# Patient Record
Sex: Male | Born: 1970 | Race: White | Hispanic: No | Marital: Single | Smoking: Former smoker
Health system: Southern US, Community
[De-identification: ages and names within clinical notes are randomized; demographics above are authoritative.]

## PROBLEM LIST (undated history)

## (undated) DIAGNOSIS — G8929 Other chronic pain: Secondary | ICD-10-CM

## (undated) DIAGNOSIS — F419 Anxiety disorder, unspecified: Secondary | ICD-10-CM

## (undated) DIAGNOSIS — E119 Type 2 diabetes mellitus without complications: Secondary | ICD-10-CM

## (undated) HISTORY — PX: TONSILLECTOMY: SUR1361

## (undated) HISTORY — PX: OTHER SURGICAL HISTORY: SHX169

## (undated) HISTORY — PX: ADENOIDECTOMY: SUR15

## (undated) HISTORY — PX: DENTAL SURGERY: SHX609

---

## 2019-06-29 ENCOUNTER — Emergency Department (HOSPITAL_COMMUNITY)
Admission: EM | Admit: 2019-06-29 | Discharge: 2019-06-29 | Disposition: A | Discharge status: Home / Self Care | Payer: PRIVATE HEALTH INSURANCE | Attending: Emergency Medicine | Admitting: Emergency Medicine

## 2019-06-29 ENCOUNTER — Encounter (HOSPITAL_COMMUNITY): Payer: Self-pay | Admitting: Emergency Medicine

## 2019-06-29 ENCOUNTER — Other Ambulatory Visit: Payer: Self-pay

## 2019-06-29 DIAGNOSIS — G8929 Other chronic pain: Secondary | ICD-10-CM | POA: Diagnosis not present

## 2019-06-29 DIAGNOSIS — M25512 Pain in left shoulder: Secondary | ICD-10-CM | POA: Insufficient documentation

## 2019-06-29 HISTORY — DX: Other chronic pain: G89.29

## 2019-06-29 NOTE — ED Notes (Signed)
An After Visit Summary was printed and given to the patient. Discharge instructions given and no further questions at this time. Pt leaving with work note as requested.

## 2019-06-29 NOTE — ED Provider Notes (Signed)
[facility]   CSN: [id number] Arrival date & time: 06/29/19  1703     History   Chief Complaint Chief Complaint  Patient presents with  . needs MD note    HPI Eric Kane is a 48 y.o. male with past medical 3 of chronic shoulder pain who presents for evaluation of left shoulder pain.  He states that yesterday at work, he felt like he exacerbated his shoulder which caused return of his chronic shoulder pain.  He did not have any trauma, injury, fall.  He states that the pain is mostly a sharp shooting pain that starts in the shoulder and goes down.  He states that it hurts worse with movement.  He states that he missed work today because of his pain and states that he needs a work note for today.  He denies any redness, swelling of the shoulder, numbness/weakness.     The history is provided by the patient.    Past Medical History:  Diagnosis Date  . Chronic shoulder pain     There are no active problems to display for this patient.   History reviewed. No pertinent surgical history.      Home Medications    Prior to Admission medications   Not on File    Family History No family history on file.  Social History Social History   Tobacco Use  . Smoking status: Not on file  Substance Use Topics  . Alcohol use: Not Currently  . Drug use: Not Currently     Allergies   Patient has no allergy information on record.   Review of Systems Review of Systems  Constitutional: Negative for fever.  Musculoskeletal:       Shoulder pain  Neurological: Negative for weakness and numbness.  All other systems reviewed and are negative.    Physical Exam Updated Vital Signs BP 110/69   Pulse 90   Temp 98.3 F (36.8 C) (Oral)   Resp 18   SpO2 99%   Physical Exam Vitals signs and nursing note reviewed.  Constitutional:      Appearance: He is well-developed.  HENT:     Head: Normocephalic and atraumatic.   Eyes:     General: No scleral icterus.       Right eye: No discharge.        Left eye: No discharge.     Conjunctiva/sclera: Conjunctivae normal.  Cardiovascular:     Pulses:          Radial pulses are 2+ on the right side and 2+ on the left side.  Pulmonary:     Effort: Pulmonary effort is normal.  Musculoskeletal:     Comments: Limited range of motion to left shoulder secondary to pain.  No overlying warmth, erythema, edema.  Diffuse tenderness in the left shoulder.  Bilateral upper extremities are symmetric in appearance.  No bony tenderness noted to the left elbow, left forearm, left wrist.  Flexion/tension of elbow intact without any difficulty.  Skin:    General: Skin is warm and dry.     Capillary Refill: Capillary refill takes less than 2 seconds.     Comments: Good distal cap refill. LUE is not dusky in appearance or cool to touch.  Neurological:     Mental Status: He is alert.     Comments: Sensation intact along major nerve distributions of BUE  Psychiatric:        Speech: Speech normal.  Behavior: Behavior normal.      ED Treatments / Results  Labs (all labs ordered are listed, but only abnormal results are displayed) Labs Reviewed - No data to display  EKG None  Radiology No results found.  Procedures Procedures (including critical care time)  Medications Ordered in ED Medications - No data to display   Initial Impression / Assessment and Plan / ED Course  I have reviewed the triage vital signs and the nursing notes.  Pertinent labs & imaging results that were available during my care of the patient were reviewed by me and considered in my medical decision making (see chart for details).        48 year old male with chronic shoulder pain who presents for evaluation of left shoulder pain.  He states that yesterday while at work, he had exacerbation of his chronic pain.  No trauma, injury.  No fevers, redness, swelling, numbness/weakness.  He is  coming to the emergency department today requesting work note. Patient is afebrile, non-toxic appearing, sitting comfortably on examination table. Vital signs reviewed and stable.  Patient is neurovascularly intact.  On exam, he has limited range of motion secondary to pain.  He states that this is consistent with his chronic shoulder pain.  He has no history of trauma, injury, fall.  He states this is exacerbation of his chronic pain.  No negation for imaging at this time.  History/physical exam not concerning for septic arthritis, ischemic limb, DVT of upper extremity.  I discussed with patient that I cannot provide him a work note for past experiences.  Will give outpatient orthopedic referral given chronic nature of symptoms. At this time, patient exhibits no emergent life-threatening condition that require further evaluation in ED or admission. Patient had ample opportunity for questions and discussion. All patient's questions were answered with full understanding. Strict return precautions discussed. Patient expresses understanding and agreement to plan.   Portions of this note were generated with L[employer]. Dictation errors may occur despite best attempts at proofreading.   Final Clinical Impressions(s) / ED Diagnoses   Final diagnoses:  Chronic left shoulder pain    ED Discharge Orders    None       Desma Mcgregor 06/29/19 2153    Dorie Rank, MD 06/30/19 1646

## 2019-06-29 NOTE — Discharge Instructions (Signed)
You can take Tylenol or Ibuprofen as directed for pain. You can alternate Tylenol and Ibuprofen every 4 hours. If you take Tylenol at 1pm, then you can take Ibuprofen at 5pm. Then you can take Tylenol again at 9pm.   Follow-up with referred orthopedic doctor.  Return the emergency department for any worsening pain, redness or swelling of the shoulder, fevers, numbness/weakness.

## 2019-06-29 NOTE — ED Triage Notes (Signed)
Per pt, states he has chronic left shoulder nerve pain-states he missed work today and due to living in a [employer], he needs a doctors note

## 2019-07-17 ENCOUNTER — Emergency Department (HOSPITAL_COMMUNITY)
Admission: EM | Admit: 2019-07-17 | Discharge: 2019-07-18 | Disposition: A | Discharge status: Home / Self Care | Payer: 59 | Attending: Emergency Medicine | Admitting: Emergency Medicine

## 2019-07-17 ENCOUNTER — Other Ambulatory Visit: Payer: Self-pay

## 2019-07-17 ENCOUNTER — Encounter (HOSPITAL_COMMUNITY): Payer: Self-pay | Admitting: Emergency Medicine

## 2019-07-17 DIAGNOSIS — Z760 Encounter for issue of repeat prescription: Secondary | ICD-10-CM | POA: Insufficient documentation

## 2019-07-17 DIAGNOSIS — R739 Hyperglycemia, unspecified: Secondary | ICD-10-CM

## 2019-07-17 DIAGNOSIS — Z794 Long term (current) use of insulin: Secondary | ICD-10-CM | POA: Diagnosis not present

## 2019-07-17 DIAGNOSIS — F17220 Nicotine dependence, chewing tobacco, uncomplicated: Secondary | ICD-10-CM | POA: Insufficient documentation

## 2019-07-17 DIAGNOSIS — R631 Polydipsia: Secondary | ICD-10-CM | POA: Diagnosis present

## 2019-07-17 DIAGNOSIS — E1165 Type 2 diabetes mellitus with hyperglycemia: Secondary | ICD-10-CM | POA: Insufficient documentation

## 2019-07-17 HISTORY — DX: Type 2 diabetes mellitus without complications: E11.9

## 2019-07-17 LAB — CBC
HCT: 47.3 % (ref 39.0–52.0)
Hemoglobin: 14.9 g/dL (ref 13.0–17.0)
MCH: 30.9 pg (ref 26.0–34.0)
MCHC: 31.5 g/dL (ref 30.0–36.0)
MCV: 98.1 fL (ref 80.0–100.0)
Platelets: 343 10*3/uL (ref 150–400)
RBC: 4.82 MIL/uL (ref 4.22–5.81)
RDW: 12.9 % (ref 11.5–15.5)
WBC: 10.8 10*3/uL — ABNORMAL HIGH (ref 4.0–10.5)
nRBC: 0 % (ref 0.0–0.2)

## 2019-07-17 LAB — URINALYSIS, ROUTINE W REFLEX MICROSCOPIC
Bacteria, UA: NONE SEEN
Bilirubin Urine: NEGATIVE
Glucose, UA: >=500 mg/dL — AB
Hgb urine dipstick: NEGATIVE
Ketones, ur: NEGATIVE mg/dL
Leukocytes,Ua: NEGATIVE
Nitrite: NEGATIVE
Protein, ur: NEGATIVE mg/dL
Specific Gravity, Urine: 1.028 (ref 1.005–1.030)
pH: 6 (ref 5.0–8.0)

## 2019-07-17 LAB — CBG MONITORING, ED: Glucose-Capillary: >600 mg/dL (ref 70–99)

## 2019-07-17 NOTE — ED Triage Notes (Addendum)
Patient states he has blood sugar due to not having the medication and needles. Patient states he has been out of his medication for two days. Patient states he takes Novolog, lantus, and metformin. Patient been out of lantus for two months.

## 2019-07-17 NOTE — ED Notes (Addendum)
CRITICAL VALUE STICKER  CRITICAL VALUE: Glu 1084  RECEIVER (on-site recipient of call): Jake T RN  DATE & TIME NOTIFIED: 07/17/19  MESSENGER (representative from lab): LE  TIME OF NOTIFICATION: 1114p  RESPONSE: see orders

## 2019-07-18 ENCOUNTER — Encounter (HOSPITAL_COMMUNITY): Payer: Self-pay | Admitting: Emergency Medicine

## 2019-07-18 LAB — CBG MONITORING, ED
Glucose-Capillary: 540 mg/dL (ref 70–99)
Glucose-Capillary: 355 mg/dL — ABNORMAL HIGH (ref 70–99)
Glucose-Capillary: 448 mg/dL — ABNORMAL HIGH (ref 70–99)
Glucose-Capillary: 237 mg/dL — ABNORMAL HIGH (ref 70–99)
Glucose-Capillary: 307 mg/dL — ABNORMAL HIGH (ref 70–99)
Glucose-Capillary: 159 mg/dL — ABNORMAL HIGH (ref 70–99)
Glucose-Capillary: 170 mg/dL — ABNORMAL HIGH (ref 70–99)
Glucose-Capillary: 268 mg/dL — ABNORMAL HIGH (ref 70–99)

## 2019-07-18 LAB — BLOOD GAS, VENOUS
Acid-Base Excess: 0.4 mmol/L (ref 0.0–2.0)
Bicarbonate: 26.4 mmol/L (ref 20.0–28.0)
O2 Saturation: 79.3 %
Patient temperature: 98.6
pCO2, Ven: 50.5 mmHg (ref 44.0–60.0)
pH, Ven: 7.339 (ref 7.250–7.430)
pO2, Ven: 49.1 mmHg — ABNORMAL HIGH (ref 32.0–45.0)

## 2019-07-18 LAB — BASIC METABOLIC PANEL
Anion gap: 14 (ref 5–15)
BUN: 20 mg/dL (ref 6–20)
CO2: 28 mmol/L (ref 22–32)
Calcium: 11.3 mg/dL — ABNORMAL HIGH (ref 8.9–10.3)
Chloride: 82 mmol/L — ABNORMAL LOW (ref 98–111)
Creatinine, Ser: 1.41 mg/dL — ABNORMAL HIGH (ref 0.61–1.24)
GFR calc Af Amer: >60 mL/min (ref >60)
GFR calc non Af Amer: 59 mL/min — ABNORMAL LOW (ref >60)
Glucose, Bld: 1084 mg/dL (ref 70–99)
Potassium: 4.9 mmol/L (ref 3.5–5.1)
Sodium: 124 mmol/L — ABNORMAL LOW (ref 135–145)

## 2019-07-18 LAB — OSMOLALITY: Osmolality: 312 mOsm/kg — ABNORMAL HIGH (ref 275–295)

## 2019-07-18 MED ORDER — SODIUM CHLORIDE 0.9 % IV SOLN: INTRAVENOUS | Status: DC

## 2019-07-18 MED ORDER — DEXTROSE 50 % IV SOLN: 0.0000 mL | INTRAVENOUS | Status: DC | PRN

## 2019-07-18 MED ORDER — INSULIN GLARGINE 100 UNIT/ML ~~LOC~~ SOLN: 20.0000 [IU] | Freq: Every day | SUBCUTANEOUS | 0 refills | Status: DC

## 2019-07-18 MED ORDER — SODIUM CHLORIDE 0.9 % IV BOLUS
20.0000 mL/kg | Freq: Once | INTRAVENOUS | Status: AC
  Administered 2019-07-18: 2268 mL via INTRAVENOUS

## 2019-07-18 MED ORDER — POTASSIUM CHLORIDE 10 MEQ/100ML IV SOLN
10.0000 meq | INTRAVENOUS | Status: AC
  Administered 2019-07-18: 10 meq via INTRAVENOUS
  Filled 2019-07-18: qty 100

## 2019-07-18 MED ORDER — METFORMIN HCL 500 MG PO TABS: 500.0000 mg | ORAL_TABLET | Freq: Two times a day (BID) | ORAL | 0 refills | Status: DC

## 2019-07-18 MED ORDER — INSULIN REGULAR(HUMAN) IN NACL 100-0.9 UT/100ML-% IV SOLN
INTRAVENOUS | Status: DC
  Administered 2019-07-18: 10.5 [IU]/h via INTRAVENOUS
  Filled 2019-07-18: qty 100

## 2019-07-18 MED ORDER — INSULIN ASPART 100 UNIT/ML ~~LOC~~ SOLN: 8.0000 [IU] | Freq: Three times a day (TID) | SUBCUTANEOUS | 0 refills | Status: DC

## 2019-07-18 MED ORDER — DEXTROSE-NACL 5-0.45 % IV SOLN: INTRAVENOUS | Status: DC

## 2019-07-18 MED ORDER — METFORMIN HCL 500 MG PO TABS
500.0000 mg | ORAL_TABLET | Freq: Once | ORAL | Status: AC
  Administered 2019-07-18: 500 mg via ORAL
  Filled 2019-07-18: qty 1

## 2019-07-18 MED ORDER — INSULIN GLARGINE 100 UNIT/ML ~~LOC~~ SOLN
10.0000 [IU] | Freq: Once | SUBCUTANEOUS | Status: AC
  Administered 2019-07-18: 10 [IU] via SUBCUTANEOUS
  Filled 2019-07-18: qty 0.1

## 2019-07-18 NOTE — Care Management (Signed)
ED CM received call from [facility] ED CSW concerning medication assistance. Contacted patient by phone at [facility] patient reports not having prescription coverage with Obamacare insurance which ends in 2 days.  Patient is a diabetic on insulin. CM discussed [place] program with guidelines, patient is agreeable and he was enrolled. Patient states utilizes a Psychologist, clinical, CM discussed having to send prescriptions to a retail pharmacy that will be open today. Patient request prescription be sent to neighborhood [facility] on [address]. Fax sent to  [phone] confirmation received.

## 2019-07-18 NOTE — Care Management (Signed)
  [place] Medication Assistance Card Name: Nabil Bubolz ID (MRN): [id number] [place]: [id number] RX Group: [id number] Discharge Date: 07/18/2019 Expiration Date:  07/29/2019                                           (must be filled within 7 days of discharge)      Dear   : Brendia Sacks  You have been approved to have the prescriptions written by your discharging physician filled through our [facility] (Medication Assistance Through [facility]) program. This program allows for a one-time (no refills) 34-day supply of selected medications for a low copay amount.  The copay is $3.00 per prescription. For instance, if you have one prescription, you will pay $3.00; for two prescriptions, you pay $6.00; for three prescriptions, you pay $9.00; and so on.  Only certain pharmacies are participating in this program with [facility]. You will need to select one of the pharmacies from the attached list and take your prescriptions, this letter, and your photo ID to one of the participating pharmacies.   We are excited that you are able to use the [facility] program to get your medications. These prescriptions must be filled within 7 days of hospital discharge or they will no longer be valid for the [facility] program. Should you have any problems with your prescriptions please contact your case management team member at [phone] for [place]/ [place] you, [place] Management Laurena Slimmer RN, BSN  ED Care Manager [phone]

## 2019-07-18 NOTE — ED Notes (Signed)
RN has spoken to Case management. Case Management will be calling patient cell phone to gather more information. Case Management will message or call RN to keep RN informed of plan of action for patient.

## 2019-07-18 NOTE — ED Provider Notes (Signed)
[facility] DEPT Provider Note: Georgena Spurling, MD, FACEP  CSN: [id number] MRN: [id number] ARRIVAL: 07/17/19 at 2132 ROOM: [place]  Hyperglycemia   HISTORY OF PRESENT ILLNESS  07/18/19 12:53 AM Eric Kane is a 48 y.o. male with type 2 diabetes.  He is out of his Metformin and has been out of his Lantus for 2 weeks.  He is here with a week history of blurred vision, polydipsia, polyuria and lethargy.  On arrival his sugar read off scale high and on his bmet was noted to be 1084.  He denies any pain, nausea or vomiting.    Past Medical History:  Diagnosis Date   Chronic shoulder pain    Diabetes mellitus ([place])     History reviewed. No pertinent surgical history.  History reviewed. No pertinent family history.  Social History   Tobacco Use   Smoking status: Never Smoker   Smokeless tobacco: Current User    Types: Snuff  Substance Use Topics   Alcohol use: Not Currently   Drug use: Not Currently    Prior to Admission medications   Medication Sig Start Date End Date Taking? Authorizing Provider  insulin aspart (NOVOLOG) 100 UNIT/ML injection Inject 8 Units into the skin 3 (three) times daily before meals.   Yes [provider]  metFORMIN (GLUCOPHAGE) 500 MG tablet Take 500 mg by mouth 2 (two) times daily with a meal.   Yes [provider]  insulin glargine (LANTUS) 100 UNIT/ML injection Inject 0.2 mLs (20 Units total) into the skin at bedtime. 07/18/19   Jakwan Sally, MD    Allergies Cephalosporins and Lithium   REVIEW OF SYSTEMS  Negative except as noted here or in the History of Present Illness.   PHYSICAL EXAMINATION  Initial Vital Signs Blood pressure 118/87, pulse 89, temperature 98.7 F (37.1 C), temperature source Oral, resp. rate 16, height 5\' 10"  (1.778 m), weight 113.4 kg, SpO2 99 %.  Examination General: Well-developed, well-nourished male in no acute distress; appearance consistent with age of  record HENT: normocephalic; atraumatic Eyes: pupils equal, round and reactive to light; extraocular muscles intact Neck: supple Heart: regular rate and rhythm Lungs: clear to auscultation bilaterally Abdomen: soft; nondistended; nontender; bowel sounds present Extremities: No deformity; full range of motion; pulses normal Neurologic: Awake, alert and oriented; motor function intact in all extremities and symmetric; no facial droop Skin: Warm and dry Psychiatric: Normal mood and affect   RESULTS  Summary of this visit's results, reviewed and interpreted by myself:   EKG Interpretation  Date/Time:    Ventricular Rate:    PR Interval:    QRS Duration:   QT Interval:    QTC Calculation:   R Axis:     Text Interpretation:        Laboratory Studies: Results for orders placed or performed during the hospital encounter of 07/17/19 (from the past 24 hour(s))  CBG monitoring, ED     Status: Abnormal   Collection Time: 07/17/19  9:55 PM  Result Value Ref Range   Glucose-Capillary >600 (HH) 70 - 99 mg/dL  Basic metabolic panel     Status: Abnormal   Collection Time: 07/17/19 10:09 PM  Result Value Ref Range   Sodium 124 (L) 135 - 145 mmol/L   Potassium 4.9 3.5 - 5.1 mmol/L   Chloride 82 (L) 98 - 111 mmol/L   CO2 28 22 - 32 mmol/L   Glucose, Bld 1,084 (HH) 70 - 99 mg/dL   BUN 20  6 - 20 mg/dL   Creatinine, Ser 7.48 (H) 0.61 - 1.24 mg/dL   Calcium 27.0 (H) 8.9 - 10.3 mg/dL   GFR calc non Af Amer 59 (L) >60 mL/min   GFR calc Af Amer >60 >60 mL/min   Anion gap 14 5 - 15  CBC     Status: Abnormal   Collection Time: 07/17/19 10:09 PM  Result Value Ref Range   WBC 10.8 (H) 4.0 - 10.5 K/uL   RBC 4.82 4.22 - 5.81 MIL/uL   Hemoglobin 14.9 13.0 - 17.0 g/dL   HCT 78.6 75.4 - 49.2 %   MCV 98.1 80.0 - 100.0 fL   MCH 30.9 26.0 - 34.0 pg   MCHC 31.5 30.0 - 36.0 g/dL   RDW 01.0 07.1 - 21.9 %   Platelets 343 150 - 400 K/uL   nRBC 0.0 0.0 - 0.2 %  Urinalysis, Routine w reflex  microscopic     Status: Abnormal   Collection Time: 07/17/19 10:09 PM  Result Value Ref Range   Color, Urine STRAW (A) YELLOW   APPearance CLEAR CLEAR   Specific Gravity, Urine 1.028 1.005 - 1.030   pH 6.0 5.0 - 8.0   Glucose, UA >=500 (A) NEGATIVE mg/dL   Hgb urine dipstick NEGATIVE NEGATIVE   Bilirubin Urine NEGATIVE NEGATIVE   Ketones, ur NEGATIVE NEGATIVE mg/dL   Protein, ur NEGATIVE NEGATIVE mg/dL   Nitrite NEGATIVE NEGATIVE   Leukocytes,Ua NEGATIVE NEGATIVE   Bacteria, UA NONE SEEN NONE SEEN  Osmolality     Status: Abnormal   Collection Time: 07/18/19  1:57 AM  Result Value Ref Range   Osmolality 312 (H) 275 - 295 mOsm/kg  Blood gas, venous     Status: Abnormal   Collection Time: 07/18/19  2:40 AM  Result Value Ref Range   pH, Ven 7.339 7.250 - 7.430   pCO2, Ven 50.5 44.0 - 60.0 mmHg   pO2, Ven 49.1 (H) 32.0 - 45.0 mmHg   Bicarbonate 26.4 20.0 - 28.0 mmol/L   Acid-Base Excess 0.4 0.0 - 2.0 mmol/L   O2 Saturation 79.3 %   Patient temperature 98.6   CBG monitoring, ED     Status: Abnormal   Collection Time: 07/18/19  2:46 AM  Result Value Ref Range   Glucose-Capillary 540 (HH) 70 - 99 mg/dL   Comment 1 Document in Chart   CBG monitoring, ED     Status: Abnormal   Collection Time: 07/18/19  3:43 AM  Result Value Ref Range   Glucose-Capillary 448 (H) 70 - 99 mg/dL  CBG monitoring, ED     Status: Abnormal   Collection Time: 07/18/19  4:18 AM  Result Value Ref Range   Glucose-Capillary 355 (H) 70 - 99 mg/dL  CBG monitoring, ED     Status: Abnormal   Collection Time: 07/18/19  4:48 AM  Result Value Ref Range   Glucose-Capillary 237 (H) 70 - 99 mg/dL  CBG monitoring, ED     Status: Abnormal   Collection Time: 07/18/19  5:13 AM  Result Value Ref Range   Glucose-Capillary 307 (H) 70 - 99 mg/dL  CBG monitoring, ED     Status: Abnormal   Collection Time: 07/18/19  6:32 AM  Result Value Ref Range   Glucose-Capillary 159 (H) 70 - 99 mg/dL  CBG monitoring, ED     Status:  Abnormal   Collection Time: 07/18/19  6:33 AM  Result Value Ref Range   Glucose-Capillary 170 (H) 70 - 99 mg/dL  Imaging Studies: No results found.  ED COURSE and MDM  Nursing notes, initial and subsequent vitals signs, including pulse oximetry, reviewed and interpreted by myself.  Vitals:   07/18/19 0402 07/18/19 0430 07/18/19 0500 07/18/19 0530  BP: 127/83 (!) 135/93 121/87 (!) 132/101  Pulse: 82 81 80 80  Resp: 17 15 18 12   Temp:      TempSrc:      SpO2: 97% 96% 96% 97%  Weight:      Height:       Medications  insulin regular, human (MYXREDLIN) 100 units/ 100 mL infusion (10.5 Units/hr Intravenous New Bag/Given 07/18/19 0258)  0.9 %  sodium chloride infusion (has no administration in time range)  dextrose 5 %-0.45 % sodium chloride infusion (has no administration in time range)  dextrose 50 % solution 0-50 mL (has no administration in time range)  potassium chloride 10 mEq in 100 mL IVPB (0 mEq Intravenous Stopped 07/18/19 0247)  metFORMIN (GLUCOPHAGE) tablet 500 mg (has no administration in time range)  insulin glargine (LANTUS) injection 10 Units (has no administration in time range)  sodium chloride 0.9 % bolus 2,268 mL (0 mL/kg  113.4 kg Intravenous Stopped 07/18/19 0454)   1:06 AM IV fluids and insulin infusion per protocol initiated.   6:29 AM Patient feeling significantly better after IV hydration and insulin.  His sugar has been as low as 237.  He states he has metformin and NovoLog at home but as noted above is out of his Lantus.  We will write a prescription for Lantus and consult social work to arrange to have his Lantus refilled pending follow-up with his PCP.  I do not believe admission is indicated at this time given his improvement.   PROCEDURES  Procedures   ED DIAGNOSES     ICD-10-CM   1. Acute hyperglycemia  R73.9   2. Medication refill  Z76.0        Tabetha Haraway, Jonny RuizJohn, MD 07/18/19 [phone]

## 2019-07-18 NOTE — ED Notes (Signed)
Pt states he has his prescription for metformin at home. He states "I have my prescription for novolog but I need to get some needles". He also states "the only prescription I do not have at home is my Lantus".

## 2019-07-18 NOTE — ED Provider Notes (Signed)
Patient seen and evaluated yesterday evening for hyperglycemia and dc by previous provider. Case Management, Mariann Laster requests medications re-sent to pharmacy for match letter for prescription assistant. Resent prescriptions to correct Pharmacy with match letter. Patient encouraged to follow up with PCP or to return to ED for new or worsening symptoms.   Jeray Shugart A, PA-C 07/18/19 1152    Lacretia Leigh, MD 07/20/19 1056

## 2019-07-18 NOTE — ED Notes (Signed)
RN has spoken to Social Work. Social Work is requesting additional information from Provider at this time.

## 2019-07-19 LAB — BLOOD GAS, VENOUS
Acid-Base Excess: 0.9 mmol/L (ref 0.0–2.0)
Bicarbonate: 27 mmol/L (ref 20.0–28.0)
O2 Saturation: 77.4 %
Patient temperature: 98.6
pCO2, Ven: 51.7 mmHg (ref 44.0–60.0)
pH, Ven: 7.3 (ref 7.250–7.430)
pO2, Ven: 46.4 mmHg — ABNORMAL HIGH (ref 32.0–45.0)

## 2019-07-20 ENCOUNTER — Telehealth: Payer: Self-pay | Admitting: *Deleted

## 2019-07-20 NOTE — Telephone Encounter (Signed)
TOC CM received call from pt and states he does not have Rx for syringes, contacted [facility] and their box of syringes are $11. Pt states he does not have PCP. Appt arranged for [facility] for 08/27/2019 at 930 am. Provided pt with information on his appt. [place], [place] ED TOC CM [phone]

## 2019-07-20 NOTE — Telephone Encounter (Signed)
Pt called regarding Rx e-scribed to pharmacy [facility] at [place]) not participating in [facility] program.  [facility] contacted [facility] on [address] to have Rx transferred.  Informed pt of transfer and availability of Rx.

## 2019-08-27 ENCOUNTER — Ambulatory Visit (INDEPENDENT_AMBULATORY_CARE_PROVIDER_SITE_OTHER): Payer: Self-pay | Admitting: Primary Care

## 2019-08-27 ENCOUNTER — Encounter (INDEPENDENT_AMBULATORY_CARE_PROVIDER_SITE_OTHER): Payer: Self-pay | Admitting: Primary Care

## 2019-08-27 ENCOUNTER — Other Ambulatory Visit: Payer: Self-pay

## 2019-08-27 DIAGNOSIS — Z7689 Persons encountering health services in other specified circumstances: Secondary | ICD-10-CM

## 2019-08-27 DIAGNOSIS — E1165 Type 2 diabetes mellitus with hyperglycemia: Secondary | ICD-10-CM

## 2019-08-27 DIAGNOSIS — Z794 Long term (current) use of insulin: Secondary | ICD-10-CM

## 2019-08-27 DIAGNOSIS — IMO0002 Reserved for concepts with insufficient information to code with codable children: Secondary | ICD-10-CM

## 2019-08-27 DIAGNOSIS — Z760 Encounter for issue of repeat prescription: Secondary | ICD-10-CM

## 2019-08-27 DIAGNOSIS — Z09 Encounter for follow-up examination after completed treatment for conditions other than malignant neoplasm: Secondary | ICD-10-CM

## 2019-08-27 MED ORDER — METFORMIN HCL 500 MG PO TABS: 1000.0000 mg | ORAL_TABLET | Freq: Two times a day (BID) | ORAL | 1 refills | Status: DC

## 2019-08-27 MED ORDER — INSULIN ASPART 100 UNIT/ML ~~LOC~~ SOLN: SUBCUTANEOUS | 5 refills | Status: AC

## 2019-08-27 MED ORDER — INSULIN GLARGINE 100 UNIT/ML ~~LOC~~ SOLN: 20.0000 [IU] | Freq: Every day | SUBCUTANEOUS | 5 refills | Status: DC

## 2019-08-27 NOTE — Progress Notes (Signed)
Virtual Visit via Telephone Note  I connected with Eric Kane on 08/27/19 at  9:30 AM EST by telephone and verified that I am speaking with the correct person using two identifiers.   I discussed the limitations, risks, security and privacy concerns of performing an evaluation and management service by telephone and the availability of in person appointments. I also discussed with the patient that there may be a patient responsible charge related to this service. The patient expressed understanding and agreed to proceed.   History of Present Illness: Eric Kane is having a hospital follow up and establishing care with new PCP. His fasting blood glucose was 395. Needs management of uncontrolled diabetes admits to increase thirst and urination. Past Medical History:  Diagnosis Date  . Chronic shoulder pain   . Diabetes mellitus (HCC)      No current outpatient medications on file prior to visit.   No current facility-administered medications on file prior to visit.   Observations/Objective: Review of Systems  Endo/Heme/Allergies: Positive for polydipsia.  Psychiatric/Behavioral: Positive for depression.  All other systems reviewed and are negative.   Assessment and Plan: [name] was seen today for hospitalization follow-up and medication refill.  Diagnoses and all orders for this visit:  Establishing care with new doctor, encounter for Gwinda Passe, NP-C will be your  (PCP) mastered prepared that is able to that will  diagnosed and treatment able to answer health concern as well as continuing care of varied medical conditions, not limited by cause, organ system, or diagnosis.   Insulin dependent type 2 diabetes mellitus, uncontrolled (HCC) Therapeutic goals for glycemic control  therapy: Less than /= 6.5 hemoglobin A1c. Decrease foods that are high in carbohydrates are the following rice, potatoes, breads, sugars, and pastas.  Reduction in the intake (eating) will  assist in lowering your blood sugars. Adjusted medication increased metformin to 1000 mg bid , Continue Novolog 8 units after meals if > 150 and  instructions and lantus 20 units at bedtime from the hospital   Hospital discharge follow-up 07/17/2019 for acute hyperglycemia patient was treated with  IV hydration and insulin. He was out of medication and  His sugar has been as low as 237.  Needed prescription  Lantus and consult social work to arrange to have his Lantus refilled pending follow-up with his PCP. Non compliant followed up recommended in November.  Other orders/Medication refill -     metFORMIN (GLUCOPHAGE) 500 MG tablet; Take 2 tablets (1,000 mg total) by mouth 2 (two) times daily with a meal. -     insulin glargine (LANTUS) 100 UNIT/ML injection; Inject 0.2 mLs (20 Units total) into the skin at bedtime. (Patient not taking: Reported on 09/06/2019) -     insulin aspart (NOVOLOG) 100 UNIT/ML injection; Inject 8 units after meals and Blood sugars greater than 150.    Follow Up Instructions:    I discussed the assessment and treatment plan with the patient. The patient was provided an opportunity to ask questions and all were answered. The patient agreed with the plan and demonstrated an understanding of the instructions.   The patient was advised to call back or seek an in-person evaluation if the symptoms worsen or if the condition fails to improve as anticipated.  I provided  16 minutes of non-face-to-face time during this encounter.Review of previous encounters, labs and imaging.   Grayce Sessions, NP

## 2019-08-27 NOTE — Progress Notes (Signed)
Pt is fasting CBG at 9:31 is 395

## 2019-09-03 ENCOUNTER — Other Ambulatory Visit (INDEPENDENT_AMBULATORY_CARE_PROVIDER_SITE_OTHER): Payer: PRIVATE HEALTH INSURANCE

## 2019-09-05 ENCOUNTER — Encounter (HOSPITAL_COMMUNITY): Payer: Self-pay | Admitting: Emergency Medicine

## 2019-09-05 ENCOUNTER — Observation Stay (HOSPITAL_COMMUNITY)
Admission: EM | Admit: 2019-09-05 | Discharge: 2019-09-06 | Disposition: A | Discharge status: Home / Self Care | Payer: Self-pay | Attending: Family Medicine | Admitting: Family Medicine

## 2019-09-05 ENCOUNTER — Other Ambulatory Visit: Payer: Self-pay

## 2019-09-05 DIAGNOSIS — Z794 Long term (current) use of insulin: Secondary | ICD-10-CM | POA: Insufficient documentation

## 2019-09-05 DIAGNOSIS — H5500 Unspecified nystagmus: Secondary | ICD-10-CM | POA: Insufficient documentation

## 2019-09-05 DIAGNOSIS — Z79899 Other long term (current) drug therapy: Secondary | ICD-10-CM | POA: Insufficient documentation

## 2019-09-05 DIAGNOSIS — R000 Tachycardia, unspecified: Secondary | ICD-10-CM | POA: Insufficient documentation

## 2019-09-05 DIAGNOSIS — IMO0002 Reserved for concepts with insufficient information to code with codable children: Secondary | ICD-10-CM

## 2019-09-05 DIAGNOSIS — Z881 Allergy status to other antibiotic agents status: Secondary | ICD-10-CM | POA: Insufficient documentation

## 2019-09-05 DIAGNOSIS — Z888 Allergy status to other drugs, medicaments and biological substances status: Secondary | ICD-10-CM | POA: Insufficient documentation

## 2019-09-05 DIAGNOSIS — E1165 Type 2 diabetes mellitus with hyperglycemia: Secondary | ICD-10-CM | POA: Insufficient documentation

## 2019-09-05 DIAGNOSIS — R404 Transient alteration of awareness: Secondary | ICD-10-CM

## 2019-09-05 DIAGNOSIS — G9340 Encephalopathy, unspecified: Principal | ICD-10-CM | POA: Diagnosis present

## 2019-09-05 DIAGNOSIS — F329 Major depressive disorder, single episode, unspecified: Secondary | ICD-10-CM | POA: Insufficient documentation

## 2019-09-05 DIAGNOSIS — Z720 Tobacco use: Secondary | ICD-10-CM | POA: Insufficient documentation

## 2019-09-05 DIAGNOSIS — Z20822 Contact with and (suspected) exposure to covid-19: Secondary | ICD-10-CM | POA: Insufficient documentation

## 2019-09-05 LAB — CBG MONITORING, ED: Glucose-Capillary: 499 mg/dL — ABNORMAL HIGH (ref 70–99)

## 2019-09-05 NOTE — ED Triage Notes (Signed)
48 yo maled BIB [facility] from Home, sober living residential program. [facility] was called to home for altered mental status. Pt was hyperglycemic with blood sugar of 413 on arrival. Pt is on metformin, but it is unknown if pt is compliant. Pt is confused and asks repetitive questions as per EMS. Stroke screen negative as per EMS.  Vitals: bp 119/81 Hr 115 spo2 97% on room air cbg 413  EMS was unable to get IV access as patient states " I am a hard stick".

## 2019-09-06 ENCOUNTER — Emergency Department (HOSPITAL_COMMUNITY): Payer: Self-pay

## 2019-09-06 ENCOUNTER — Observation Stay (HOSPITAL_COMMUNITY): Payer: Self-pay

## 2019-09-06 ENCOUNTER — Encounter (HOSPITAL_COMMUNITY): Payer: Self-pay | Admitting: Family Medicine

## 2019-09-06 DIAGNOSIS — E1165 Type 2 diabetes mellitus with hyperglycemia: Secondary | ICD-10-CM

## 2019-09-06 DIAGNOSIS — Z794 Long term (current) use of insulin: Secondary | ICD-10-CM

## 2019-09-06 DIAGNOSIS — IMO0002 Reserved for concepts with insufficient information to code with codable children: Secondary | ICD-10-CM

## 2019-09-06 DIAGNOSIS — G9340 Encephalopathy, unspecified: Secondary | ICD-10-CM | POA: Diagnosis present

## 2019-09-06 LAB — RAPID URINE DRUG SCREEN, HOSP PERFORMED
Amphetamines: NOT DETECTED
Barbiturates: NOT DETECTED
Benzodiazepines: NOT DETECTED
Cocaine: NOT DETECTED
Opiates: NOT DETECTED
Tetrahydrocannabinol: NOT DETECTED

## 2019-09-06 LAB — CBC WITH DIFFERENTIAL/PLATELET
Abs Immature Granulocytes: 0.03 10*3/uL (ref 0.00–0.07)
Basophils Absolute: 0 10*3/uL (ref 0.0–0.1)
Basophils Relative: 0 %
Eosinophils Absolute: 0 10*3/uL (ref 0.0–0.5)
Eosinophils Relative: 0 %
HCT: 48.7 % (ref 39.0–52.0)
Hemoglobin: 16.1 g/dL (ref 13.0–17.0)
Immature Granulocytes: 0 %
Lymphocytes Relative: 13 %
Lymphs Abs: 1.2 10*3/uL (ref 0.7–4.0)
MCH: 31.6 pg (ref 26.0–34.0)
MCHC: 33.1 g/dL (ref 30.0–36.0)
MCV: 95.5 fL (ref 80.0–100.0)
Monocytes Absolute: 0.2 10*3/uL (ref 0.1–1.0)
Monocytes Relative: 2 %
Neutro Abs: 7.6 10*3/uL (ref 1.7–7.7)
Neutrophils Relative %: 85 %
Platelets: 278 10*3/uL (ref 150–400)
RBC: 5.1 MIL/uL (ref 4.22–5.81)
RDW: 13.4 % (ref 11.5–15.5)
WBC: 9.1 10*3/uL (ref 4.0–10.5)
nRBC: 0 % (ref 0.0–0.2)

## 2019-09-06 LAB — COMPREHENSIVE METABOLIC PANEL
ALT: 28 U/L (ref 0–44)
AST: 23 U/L (ref 15–41)
Albumin: 5.1 g/dL — ABNORMAL HIGH (ref 3.5–5.0)
Alkaline Phosphatase: 103 U/L (ref 38–126)
Anion gap: 19 — ABNORMAL HIGH (ref 5–15)
BUN: 21 mg/dL — ABNORMAL HIGH (ref 6–20)
CO2: 20 mmol/L — ABNORMAL LOW (ref 22–32)
Calcium: 9.4 mg/dL (ref 8.9–10.3)
Chloride: 93 mmol/L — ABNORMAL LOW (ref 98–111)
Creatinine, Ser: 1.2 mg/dL (ref 0.61–1.24)
GFR calc Af Amer: >60 mL/min (ref >60)
GFR calc non Af Amer: >60 mL/min (ref >60)
Glucose, Bld: 473 mg/dL — ABNORMAL HIGH (ref 70–99)
Potassium: 4.4 mmol/L (ref 3.5–5.1)
Sodium: 132 mmol/L — ABNORMAL LOW (ref 135–145)
Total Bilirubin: 1.9 mg/dL — ABNORMAL HIGH (ref 0.3–1.2)
Total Protein: 8.8 g/dL — ABNORMAL HIGH (ref 6.5–8.1)

## 2019-09-06 LAB — BLOOD GAS, VENOUS
Acid-base deficit: 4.2 mmol/L — ABNORMAL HIGH (ref 0.0–2.0)
Bicarbonate: 22.3 mmol/L (ref 20.0–28.0)
O2 Saturation: 66.3 %
Patient temperature: 98.6
pCO2, Ven: 47.9 mmHg (ref 44.0–60.0)
pH, Ven: 7.29 (ref 7.250–7.430)
pO2, Ven: 39.5 mmHg (ref 32.0–45.0)

## 2019-09-06 LAB — TSH: TSH: 2.536 u[IU]/mL (ref 0.350–4.500)

## 2019-09-06 LAB — URINALYSIS, ROUTINE W REFLEX MICROSCOPIC
Glucose, UA: >=500 mg/dL — AB
Hgb urine dipstick: NEGATIVE
Ketones, ur: NEGATIVE mg/dL
Leukocytes,Ua: NEGATIVE
Nitrite: NEGATIVE
Protein, ur: NEGATIVE mg/dL
Specific Gravity, Urine: 1.032 — ABNORMAL HIGH (ref 1.005–1.030)
pH: 5 (ref 5.0–8.0)

## 2019-09-06 LAB — CBG MONITORING, ED
Glucose-Capillary: 378 mg/dL — ABNORMAL HIGH (ref 70–99)
Glucose-Capillary: 363 mg/dL — ABNORMAL HIGH (ref 70–99)
Glucose-Capillary: 321 mg/dL — ABNORMAL HIGH (ref 70–99)

## 2019-09-06 LAB — LACTIC ACID, PLASMA
Lactic Acid, Venous: 1.5 mmol/L (ref 0.5–1.9)
Lactic Acid, Venous: 1.7 mmol/L (ref 0.5–1.9)

## 2019-09-06 LAB — VITAMIN B12: Vitamin B-12: 533 pg/mL (ref 180–914)

## 2019-09-06 LAB — HIV ANTIBODY (ROUTINE TESTING W REFLEX): HIV Screen 4th Generation wRfx: NONREACTIVE

## 2019-09-06 LAB — HEMOGLOBIN A1C
Hgb A1c MFr Bld: 12.2 % — ABNORMAL HIGH (ref 4.8–5.6)
Mean Plasma Glucose: 303.44 mg/dL

## 2019-09-06 LAB — CK: Total CK: 199 U/L (ref 49–397)

## 2019-09-06 LAB — SARS CORONAVIRUS 2 (TAT 6-24 HRS): SARS Coronavirus 2: NEGATIVE

## 2019-09-06 LAB — AMMONIA: Ammonia: 34 umol/L (ref 9–35)

## 2019-09-06 LAB — ETHANOL: Alcohol, Ethyl (B): <10 mg/dL (ref <10)

## 2019-09-06 LAB — SALICYLATE LEVEL: Salicylate Lvl: <7 mg/dL — ABNORMAL LOW (ref 7.0–30.0)

## 2019-09-06 LAB — RPR: RPR Ser Ql: NONREACTIVE

## 2019-09-06 LAB — ACETAMINOPHEN LEVEL: Acetaminophen (Tylenol), Serum: <10 ug/mL — ABNORMAL LOW (ref 10–30)

## 2019-09-06 MED ORDER — INSULIN ASPART 100 UNIT/ML ~~LOC~~ SOLN
0.0000 [IU] | Freq: Three times a day (TID) | SUBCUTANEOUS | Status: DC
  Administered 2019-09-06: 13:00:00 7 [IU] via SUBCUTANEOUS
  Administered 2019-09-06: 9 [IU] via SUBCUTANEOUS
  Filled 2019-09-06: qty 0.09

## 2019-09-06 MED ORDER — SODIUM CHLORIDE 0.9 % IV SOLN: INTRAVENOUS | Status: AC

## 2019-09-06 MED ORDER — SODIUM CHLORIDE 0.9 % IV BOLUS
1000.0000 mL | Freq: Once | INTRAVENOUS | Status: AC
  Administered 2019-09-06: 1000 mL via INTRAVENOUS

## 2019-09-06 MED ORDER — INSULIN GLARGINE 100 UNIT/ML ~~LOC~~ SOLN
10.0000 [IU] | Freq: Every day | SUBCUTANEOUS | Status: DC
  Filled 2019-09-06: qty 0.1

## 2019-09-06 MED ORDER — THIAMINE HCL 100 MG/ML IJ SOLN: 250.0000 mg | INTRAVENOUS | Status: DC

## 2019-09-06 MED ORDER — THIAMINE HCL 100 MG/ML IJ SOLN
500.0000 mg | Freq: Three times a day (TID) | INTRAVENOUS | Status: DC
  Administered 2019-09-06 (×2): 500 mg via INTRAVENOUS
  Filled 2019-09-06 (×3): qty 5

## 2019-09-06 MED ORDER — INSULIN ASPART 100 UNIT/ML ~~LOC~~ SOLN
0.0000 [IU] | Freq: Every day | SUBCUTANEOUS | Status: DC
  Filled 2019-09-06: qty 0.05

## 2019-09-06 MED ORDER — LORAZEPAM 2 MG/ML IJ SOLN: 1.0000 mg | INTRAMUSCULAR | Status: DC | PRN

## 2019-09-06 NOTE — Discharge Instructions (Signed)

## 2019-09-06 NOTE — ED Notes (Signed)
Patient transported to MRI 

## 2019-09-06 NOTE — ED Notes (Signed)
Pt asking why he's here and why he is getting a MRI.  Pt made aware of care plan.  Multiple staff report patient has been told by several other staff members why he is here and plan of action.  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  Denies pain.

## 2019-09-06 NOTE — Discharge Summary (Signed)
Physician Discharge Summary  Eric Kane FHL:[id number] DOB: [birth year] DOA: 09/05/2019  PCP: Eric Sessions, NP  Admit date: 09/05/2019 Discharge date: 09/06/2019  Admitted From: [facility] living house Disposition: Sober living house  Recommendations for Outpatient Follow-up:  1. Follow up with PCP in 1-2 weeks 2. Please obtain BMP/CBC in one week 3. Please follow up on the following pending results:  Home Health: None Equipment/Devices: None  Discharge Condition: Stable CODE STATUS: Full Diet recommendation: Diabetic and cardiac  Subjective: Seen and examined.  No complaints.  He is alert and oriented.  HPI: Eric Kane is a 49 y.o. male with medical history significant for uncontrolled insulin-dependent diabetes mellitus, depression, history of drug and alcohol abuse, renal mass, and chronic shoulder pain, now presenting to the emergency department from his sober living facility where EMS was called out due to altered mental status.  Patient was found to be alert but confused, tachycardic in the 110s, and hyperglycemic in the 400s.  He acknowledges poor adherence with his insulin regimen due to difficulty obtaining medication and supplies, but denies any alcohol or recreational drug use since September 2020.  He denies any suicidal or homicidal ideation, and denies any hallucinations.  He is unsure why he is in the emergency department.  He acknowledges the prior admissions to other hospital for complications of dextromethorphan overdose, but denies misusing any medications recently.  He denies fevers, chills, cough, shortness of breath, chest pain, or headache.  He denies any focal numbness or weakness.  ED Course: Upon arrival to the ED, patient is found to be afebrile, saturating mid 90s on room air, tachycardic in the 110s, and with stable blood pressure.  EKG features sinus tachycardia with rate 110.  Chest x-rays negative for acute cardiopulmonary disease.  Noncontrast head CT  with question of asymmetric low-density involving the inferior left cerebellum.  Chemistry panel with glucose 473, bicarbonate 20, anion gap 21, and normal ammonia level.  CBC is unremarkable.  UDS is negative.  Urinalysis notable for an elevated specific gravity and glucose urea.  Ethanol level undetectable.  ED physician discussed the case with neurology who recommended MRI brain.  Patient was given a liter of normal saline, COVID screening test was ordered but not yet resulted, and hospitalists consulted for admission.  Brief/Interim Summary: Patient was admitted for acute encephalopathy of unknown etiology.  Initial head CT showed possible left cerebellar hypodensity this was followed by MRI of the brain which was completely unremarkable.  Due to patient's past history of admission in June 2020 and November 2019 due to dextromethorphan toxicity, UDS was obtained which was unremarkable.  Extended work-up was initiated.  He was tested negative for HIV as well as B12.  B1 and RPR are still pending.  He was also found to be hyperglycemic at the time of admission with blood sugar around 400.  After talking to patient.  Patient at this point in time completely is alert and oriented x4.  He clearly states that he does not take his insulin regimen as recommended due to financial affordability.  His hemoglobin A1c is 12.2.  His acute encephalopathy has resolved completely.  Etiology was not found but suspected hyperglycemic toxicity.  Unfortunately, patient's hyperglycemia could not be controlled as outpatient unless he follows prescribed regime of his diabetic medications including insulin.  Now that he is hemodynamically stable and does not need further inpatient management so he is going to be discharged home.  I have not made any changes to his current regime  of insulin due to the fact that he is not even taking them and increasing his current regimen might put him at risk of having hypoglycemia.  I defer for the  lab results to be followed by his PCP at his next visit next week.  Discharge Diagnoses:  Principal Problem:   Acute encephalopathy Active Problems:   Insulin dependent type 2 diabetes mellitus, uncontrolled ([place])    Discharge Instructions  Discharge Instructions    Discharge patient   Complete by: As directed    Discharge disposition: 01-Home or Self Care   Discharge patient date: 09/06/2019     Allergies as of 09/06/2019      Reactions   Cephalosporins Anaphylaxis   Lithium Anaphylaxis      Medication List    TAKE these medications   buPROPion 150 MG 24 hr tablet Commonly known as: WELLBUTRIN XL Take 150 mg by mouth daily after breakfast.   insulin aspart 100 UNIT/ML injection Commonly known as: novoLOG Inject 8 units after meals and Blood sugars greater than 150.   insulin glargine 100 UNIT/ML injection Commonly known as: LANTUS Inject 0.2 mLs (20 Units total) into the skin at bedtime.   lamoTRIgine 200 MG tablet Commonly known as: LAMICTAL Take 200 mg by mouth at bedtime.   metFORMIN 500 MG tablet Commonly known as: GLUCOPHAGE Take 2 tablets (1,000 mg total) by mouth 2 (two) times daily with a meal.      Follow-up Information    Eric Perna, NP Follow up in 1 week(s).   Specialty: Internal Medicine Contact information: [address] [place] [address] [phone]          Allergies  Allergen Reactions  . Cephalosporins Anaphylaxis  . Lithium Anaphylaxis    Consultations: Neurology was consulted by ED.   Procedures/Studies: DG Chest 2 View  Result Date: 09/06/2019 CLINICAL DATA:  Altered level of consciousness. EXAM: CHEST - 2 VIEW COMPARISON:  None. FINDINGS: The heart size and mediastinal contours are within normal limits. Both lungs are clear. The visualized skeletal structures are unremarkable. IMPRESSION: No active cardiopulmonary disease. Electronically Signed   By: Constance Holster M.D.   On: 09/06/2019 01:38   CT  HEAD WO CONTRAST  Result Date: 09/06/2019 CLINICAL DATA:  Altered mental status (AMS), unclear cause EXAM: CT HEAD WITHOUT CONTRAST TECHNIQUE: Contiguous axial images were obtained from the base of the skull through the vertex without intravenous contrast. COMPARISON:  None. FINDINGS: Brain: Possible asymmetric low-density in the inferior left cerebellum. Alternatively this may be related to artifact. No intracranial hemorrhage. No midline shift or mass effect. No hydrocephalus. Basilar cisterns are patent. Vascular: No hyperdense vessel. Skull: No fracture or focal lesion. Sinuses/Orbits: Tiny mucous retention cyst in the right maxillary sinus. Paranasal sinuses and mastoid air cells are otherwise clear. The visualized orbits are unremarkable. Other: None. IMPRESSION: Possible asymmetric low-density in the inferior left cerebellum which may represent ischemia, versus skull base artifact. Consider MRI for further evaluation. Otherwise negative head CT. Electronically Signed   By: Keith Rake M.D.   On: 09/06/2019 01:29   MR BRAIN WO CONTRAST  Result Date: 09/06/2019 CLINICAL DATA:  Encephalopathy. EXAM: MRI HEAD WITHOUT CONTRAST TECHNIQUE: Multiplanar, multiecho pulse sequences of the brain and surrounding structures were obtained without intravenous contrast. COMPARISON:  Head CT 09/06/2019 FINDINGS: Brain: There is no evidence of acute infarct, intracranial hemorrhage, mass, midline shift, or extra-axial fluid collection. The ventricles and sulci are normal. Periventricular and subcortical white matter T2 hyperintensities are nonspecific  but compatible with minimal chronic small vessel ischemic disease. Vascular: Major intracranial vascular flow voids are preserved. Skull and upper cervical spine: Unremarkable bone marrow signal. Sinuses/Orbits: Unremarkable orbits. Minimal mucosal thickening in the maxillary sinuses with a small mucous retention cyst on the right. Clear mastoid air cells. Other: None.  IMPRESSION: 1. No acute intracranial abnormality. 2. Minimal chronic small vessel ischemic disease. Electronically Signed   By: Sebastian Ache M.D.   On: 09/06/2019 07:41      Discharge Exam: Vitals:   09/06/19 0800 09/06/19 0900  BP: (!) 125/96 (!) 116/91  Pulse: (!) 101 (!) 103  Resp: 15 12  Temp:    SpO2: 97% 97%   Vitals:   09/06/19 0530 09/06/19 0600 09/06/19 0800 09/06/19 0900  BP: 119/81 (!) 150/98 (!) 125/96 (!) 116/91  Pulse: (!) 103  (!) 101 (!) 103  Resp: 19  15 12   Temp:      TempSrc:      SpO2: 95%  97% 97%    General: Pt is alert, awake, not in acute distress Cardiovascular: RRR, S1/S2 +, no rubs, no gallops Respiratory: CTA bilaterally, no wheezing, no rhonchi Abdominal: Soft, NT, ND, bowel sounds + Extremities: no edema, no cyanosis    The results of significant diagnostics from this hospitalization (including imaging, microbiology, ancillary and laboratory) are listed below for reference.     Microbiology: Recent Results (from the past 240 hour(s))  SARS CORONAVIRUS 2 (TAT 6-24 HRS) Nasopharyngeal Nasopharyngeal Swab     Status: None   Collection Time: 09/06/19  3:03 AM   Specimen: Nasopharyngeal Swab  Result Value Ref Range Status   SARS Coronavirus 2 NEGATIVE NEGATIVE Final    Comment: (NOTE) SARS-CoV-2 target nucleic acids are NOT DETECTED. The SARS-CoV-2 RNA is generally detectable in upper and lower respiratory specimens during the acute phase of infection. Negative results do not preclude SARS-CoV-2 infection, do not rule out co-infections with other pathogens, and should not be used as the sole basis for treatment or other patient management decisions. Negative results must be combined with clinical observations, patient history, and epidemiological information. The expected result is Negative. Fact Sheet for Patients: 09/08/19 Fact Sheet for Healthcare  Providers: HairSlick.no This test is not yet approved or cleared by the [place] FDA and  has been authorized for detection and/or diagnosis of SARS-CoV-2 by FDA under an Emergency Use Authorization (EUA). This EUA will remain  in effect (meaning this test can be used) for the duration of the COVID-19 declaration under Section 56 4(b)(1) of the Act, 21 U.S.C. section 360bbb-3(b)(1), unless the authorization is terminated or revoked sooner. Performed at [facility], [address], [place], [place]      Labs: BNP (last 3 results) No results for input(s): BNP in the last 8760 hours. Basic Metabolic Panel: Recent Labs  Lab 09/06/19 0020  NA 132*  K 4.4  CL 93*  CO2 20*  GLUCOSE 473*  BUN 21*  CREATININE 1.20  CALCIUM 9.4   Liver Function Tests: Recent Labs  Lab 09/06/19 0020  AST 23  ALT 28  ALKPHOS 103  BILITOT 1.9*  PROT 8.8*  ALBUMIN 5.1*   No results for input(s): LIPASE, AMYLASE in the last 168 hours. Recent Labs  Lab 09/06/19 0055  AMMONIA 34   CBC: Recent Labs  Lab 09/06/19 0020  WBC 9.1  NEUTROABS 7.6  HGB 16.1  HCT 48.7  MCV 95.5  PLT 278   Cardiac Enzymes: No results  for input(s): CKTOTAL, CKMB, CKMBINDEX, TROPONINI in the last 168 hours. BNP: Invalid input(s): POCBNP CBG: Recent Labs  Lab 09/05/19 2335 09/06/19 0356 09/06/19 0741 09/06/19 1229  GLUCAP 499* 378* 363* 321*   D-Dimer No results for input(s): DDIMER in the last 72 hours. Hgb A1c Recent Labs    09/06/19 0111  HGBA1C 12.2*   Lipid Profile No results for input(s): CHOL, HDL, LDLCALC, TRIG, CHOLHDL, LDLDIRECT in the last 72 hours. Thyroid function studies Recent Labs    09/06/19 0303  TSH 2.536   Anemia work up Recent Labs    09/06/19 0303  VITAMINB12 533   Urinalysis    Component Value Date/Time   COLORURINE STRAW (A) 09/06/2019 0015   APPEARANCEUR TURBID (A) 09/06/2019 0015   LABSPEC 1.032 (H)  09/06/2019 0015   PHURINE 5.0 09/06/2019 0015   GLUCOSEU >=500 (A) 09/06/2019 0015   HGBUR NEGATIVE 09/06/2019 0015   BILIRUBINUR MODERATE (A) 09/06/2019 0015   KETONESUR NEGATIVE 09/06/2019 0015   PROTEINUR NEGATIVE 09/06/2019 0015   NITRITE NEGATIVE 09/06/2019 0015   LEUKOCYTESUR NEGATIVE 09/06/2019 0015   Sepsis Labs Invalid input(s): PROCALCITONIN,  WBC,  LACTICIDVEN Microbiology Recent Results (from the past 240 hour(s))  SARS CORONAVIRUS 2 (TAT 6-24 HRS) Nasopharyngeal Nasopharyngeal Swab     Status: None   Collection Time: 09/06/19  3:03 AM   Specimen: Nasopharyngeal Swab  Result Value Ref Range Status   SARS Coronavirus 2 NEGATIVE NEGATIVE Final    Comment: (NOTE) SARS-CoV-2 target nucleic acids are NOT DETECTED. The SARS-CoV-2 RNA is generally detectable in upper and lower respiratory specimens during the acute phase of infection. Negative results do not preclude SARS-CoV-2 infection, do not rule out co-infections with other pathogens, and should not be used as the sole basis for treatment or other patient management decisions. Negative results must be combined with clinical observations, patient history, and epidemiological information. The expected result is Negative. Fact Sheet for Patients: HairSlick.no Fact Sheet for Healthcare Providers: quierodirigir.com This test is not yet approved or cleared by the [place] FDA and  has been authorized for detection and/or diagnosis of SARS-CoV-2 by FDA under an Emergency Use Authorization (EUA). This EUA will remain  in effect (meaning this test can be used) for the duration of the COVID-19 declaration under Section 56 4(b)(1) of the Act, 21 U.S.C. section 360bbb-3(b)(1), unless the authorization is terminated or revoked sooner. Performed at [facility], [address], [place], [place] [address]      Time coordinating discharge: Over 30  minutes  SIGNED:   Hughie Closs, MD  [facility] 09/06/2019, 12:32 PM  If 7PM-7AM, please contact night-coverage [email or web address]

## 2019-09-06 NOTE — H&P (Signed)
History and Physical    Eric Kane NWG:[id number] DOB: July 21, 1971 DOA: 09/05/2019  PCP: Kerin Perna, NP   Patient coming from: Sober living house   Chief Complaint: AMS, hyperglycemia   HPI: Eric Kane is a 49 y.o. male with medical history significant for uncontrolled insulin-dependent diabetes mellitus, depression, history of drug and alcohol abuse, renal mass, and chronic shoulder pain, now presenting to the emergency department from his sober living facility where EMS was called out due to altered mental status.  Patient was found to be alert but confused, tachycardic in the 110s, and hyperglycemic in the 400s.  He acknowledges poor adherence with his insulin regimen due to difficulty obtaining medication and supplies, but denies any alcohol or recreational drug use since September 2020.  He denies any suicidal or homicidal ideation, and denies any hallucinations.  He is unsure why he is in the emergency department.  He acknowledges the prior admissions to other hospital for complications of dextromethorphan overdose, but denies misusing any medications recently.  He denies fevers, chills, cough, shortness of breath, chest pain, or headache.  He denies any focal numbness or weakness.  ED Course: Upon arrival to the ED, patient is found to be afebrile, saturating mid 90s on room air, tachycardic in the 110s, and with stable blood pressure.  EKG features sinus tachycardia with rate 110.  Chest x-rays negative for acute cardiopulmonary disease.  Noncontrast head CT with question of asymmetric low-density involving the inferior left cerebellum.  Chemistry panel with glucose 473, bicarbonate 20, anion gap 21, and normal ammonia level.  CBC is unremarkable.  UDS is negative.  Urinalysis notable for an elevated specific gravity and glucose urea.  Ethanol level undetectable.  ED physician discussed the case with neurology who recommended MRI brain.  Patient was given a liter of normal saline,  COVID screening test was ordered but not yet resulted, and hospitalists consulted for admission.  Review of Systems:  All other systems reviewed and apart from HPI, are negative.  Past Medical History:  Diagnosis Date  . Chronic shoulder pain   . Diabetes mellitus ([place])     History reviewed. No pertinent surgical history.   reports that he has never smoked. His smokeless tobacco use includes snuff. He reports previous alcohol use. He reports previous drug use.  Allergies  Allergen Reactions  . Cephalosporins Anaphylaxis  . Lithium Anaphylaxis    History reviewed. No pertinent family history.   Prior to Admission medications   Medication Sig Start Date End Date Taking? Authorizing Provider  buPROPion (WELLBUTRIN XL) 150 MG 24 hr tablet Take 150 mg by mouth daily after breakfast. 08/17/19  Yes [provider]  insulin aspart (NOVOLOG) 100 UNIT/ML injection Inject 8 units after meals and Blood sugars greater than 150. 08/27/19  Yes Kerin Perna, NP  lamoTRIgine (LAMICTAL) 200 MG tablet Take 200 mg by mouth at bedtime. 08/17/19  Yes [provider]  metFORMIN (GLUCOPHAGE) 500 MG tablet Take 2 tablets (1,000 mg total) by mouth 2 (two) times daily with a meal. 08/27/19  Yes Kerin Perna, NP  insulin glargine (LANTUS) 100 UNIT/ML injection Inject 0.2 mLs (20 Units total) into the skin at bedtime. Patient not taking: Reported on 09/06/2019 08/27/19   Kerin Perna, NP    Physical Exam: Vitals:   09/06/19 0200 09/06/19 0214 09/06/19 0230 09/06/19 0307  BP: 120/75 120/75 112/80 128/82  Pulse: (!) 113 (!) 109 (!) 109 (!) 106  Resp:  13  11  Temp:  TempSrc:      SpO2: 95% 96% 96% 100%     Constitutional: NAD, agitation  Eyes: PERTLA, lids and conjunctivae normal ENMT: Mucous membranes are moist. Posterior pharynx clear of any exudate or lesions.   Neck: normal, supple, no masses, no thyromegaly Respiratory:  no wheezing, no crackles. No  accessory muscle use.  Cardiovascular: Rate ~120 and regular. No extremity edema.   Abdomen: No distension, no tenderness, soft. Bowel sounds active.  Musculoskeletal: no clubbing / cyanosis. No joint deformity upper and lower extremities.   Skin: no significant rashes, lesions, ulcers. Warm, dry, well-perfused. Neurologic: CN 2-12 grossly intact. Sensation intact. Strength 5/5 in all 4 limbs.  Psychiatric: Alert, agitated, oriented to person, place, and time but asking repetitive questions. Cooperative.    Labs and Imaging on Admission: I have personally reviewed following labs and imaging studies  CBC: Recent Labs  Lab 09/06/19 0020  WBC 9.1  NEUTROABS 7.6  HGB 16.1  HCT 48.7  MCV 95.5  PLT 278   Basic Metabolic Panel: Recent Labs  Lab 09/06/19 0020  NA 132*  K 4.4  CL 93*  CO2 20*  GLUCOSE 473*  BUN 21*  CREATININE 1.20  CALCIUM 9.4   GFR: CrCl cannot be calculated (Unknown ideal weight.). Liver Function Tests: Recent Labs  Lab 09/06/19 0020  AST 23  ALT 28  ALKPHOS 103  BILITOT 1.9*  PROT 8.8*  ALBUMIN 5.1*   No results for input(s): LIPASE, AMYLASE in the last 168 hours. Recent Labs  Lab 09/06/19 0055  AMMONIA 34   Coagulation Profile: No results for input(s): INR, PROTIME in the last 168 hours. Cardiac Enzymes: No results for input(s): CKTOTAL, CKMB, CKMBINDEX, TROPONINI in the last 168 hours. BNP (last 3 results) No results for input(s): PROBNP in the last 8760 hours. HbA1C: No results for input(s): HGBA1C in the last 72 hours. CBG: Recent Labs  Lab 09/05/19 2335  GLUCAP 499*   Lipid Profile: No results for input(s): CHOL, HDL, LDLCALC, TRIG, CHOLHDL, LDLDIRECT in the last 72 hours. Thyroid Function Tests: No results for input(s): TSH, T4TOTAL, FREET4, T3FREE, THYROIDAB in the last 72 hours. Anemia Panel: No results for input(s): VITAMINB12, FOLATE, FERRITIN, TIBC, IRON, RETICCTPCT in the last 72 hours. Urine analysis:    Component  Value Date/Time   COLORURINE STRAW (A) 09/06/2019 0015   APPEARANCEUR TURBID (A) 09/06/2019 0015   LABSPEC 1.032 (H) 09/06/2019 0015   PHURINE 5.0 09/06/2019 0015   GLUCOSEU >=500 (A) 09/06/2019 0015   HGBUR NEGATIVE 09/06/2019 0015   BILIRUBINUR MODERATE (A) 09/06/2019 0015   KETONESUR NEGATIVE 09/06/2019 0015   PROTEINUR NEGATIVE 09/06/2019 0015   NITRITE NEGATIVE 09/06/2019 0015   LEUKOCYTESUR NEGATIVE 09/06/2019 0015   Sepsis Labs: @LABRCNTIP (procalcitonin:4,lacticidven:4) )No results found for this or any previous visit (from the past 240 hour(s)).   Radiological Exams on Admission: DG Chest 2 View  Result Date: 09/06/2019 CLINICAL DATA:  Altered level of consciousness. EXAM: CHEST - 2 VIEW COMPARISON:  None. FINDINGS: The heart size and mediastinal contours are within normal limits. Both lungs are clear. The visualized skeletal structures are unremarkable. IMPRESSION: No active cardiopulmonary disease. Electronically Signed   By: 09/08/2019 M.D.   On: 09/06/2019 01:38   CT HEAD WO CONTRAST  Result Date: 09/06/2019 CLINICAL DATA:  Altered mental status (AMS), unclear cause EXAM: CT HEAD WITHOUT CONTRAST TECHNIQUE: Contiguous axial images were obtained from the base of the skull through the vertex without intravenous contrast. COMPARISON:  None. FINDINGS: Brain:  Possible asymmetric low-density in the inferior left cerebellum. Alternatively this may be related to artifact. No intracranial hemorrhage. No midline shift or mass effect. No hydrocephalus. Basilar cisterns are patent. Vascular: No hyperdense vessel. Skull: No fracture or focal lesion. Sinuses/Orbits: Tiny mucous retention cyst in the right maxillary sinus. Paranasal sinuses and mastoid air cells are otherwise clear. The visualized orbits are unremarkable. Other: None. IMPRESSION: Possible asymmetric low-density in the inferior left cerebellum which may represent ischemia, versus skull base artifact. Consider MRI for  further evaluation. Otherwise negative head CT. Electronically Signed   By: Narda Rutherford M.D.   On: 09/06/2019 01:29    EKG: Independently reviewed. Sinus tachycardia (rate 110).   Assessment/Plan  1. Acute encephalopathy  - Presents from sober living home with AMS, is tachycardic in ED with nystagmus, and agitation, has negative UDS, possible left cerebellar hypodensity on CT head  - Neurology recommended MRI brain  - Patient has hx of admissions to OSH in June 2020 and November 2019 with dextromethorphan toxicity, denied trying to harm himself but was trying to get high  - He adamantly denies any ingestions in ED  - Presenting s/s could be secondary to recurrent dextromethorphan overdose though patient denies; given hx of alcoholism, Wernicke encephalopathy also considered; the possible hypodensity on CT head will also need further evaluation   - Check thiamine level and start IV thiamine once the level has been drawn, check MRI brain, check APAP and salicylate levels, continue neuro checks and supportive care   2. Insulin-dependent DM  - Serum glucose is 473 in ED without DKA, A1c was 10.2% in September  - Check CBG's, continue Lantus and Novolog, hold metformin for now   3. Depression  - Patient denies any SI, HI, or hallucinations in ED  - Hold Wellbutrin for now, continue Lamictal    DVT prophylaxis: SCD's  Code Status: Full  Family Communication: Discussed with patient  Consults called: None  Admission status: Observation     Briscoe Deutscher, MD [facility] Pager: See [email or web address]  If 7AM-7PM, please contact the daytime attending [email or web address]  09/06/2019, 3:26 AM

## 2019-09-06 NOTE — ED Provider Notes (Signed)
[facility]-EMERGENCY DEPT Provider Note   CSN: [id number] Arrival date & time: 09/05/19  2320     History Chief Complaint  Patient presents with  . Hyperglycemia  . Altered Mental Status    Eric Kane is a 49 y.o. male.  49 yo M with a chief complaints of altered mental status.  Thinks that this started today.  Denies recent head injury.  Denies any recent medication change.  Denies cough congestion fevers or chills.  Denies alcohol or illegal drug use.  Has some difficulty providing much further history.  Level 5 caveat.   The history is provided by the patient.  Hyperglycemia Associated symptoms: altered mental status and confusion   Associated symptoms: no abdominal pain, no chest pain, no fever, no shortness of breath and no vomiting   Altered Mental Status Presenting symptoms: confusion   Severity:  Moderate Most recent episode:  2 days ago Episode history:  Continuous Duration:  2 days Timing:  Constant Progression:  Worsening Chronicity:  New Associated symptoms: no abdominal pain, no fever, no headaches, no palpitations, no rash and no vomiting        Past Medical History:  Diagnosis Date  . Chronic shoulder pain   . Diabetes mellitus [facility])     Patient Active Problem List   Diagnosis Date Noted  . Acute encephalopathy 09/06/2019    History reviewed. No pertinent surgical history.     History reviewed. No pertinent family history.  Social History   Tobacco Use  . Smoking status: Never Smoker  . Smokeless tobacco: Current User    Types: Snuff  Substance Use Topics  . Alcohol use: Not Currently  . Drug use: Not Currently    Home Medications Prior to Admission medications   Medication Sig Start Date End Date Taking? Authorizing Provider  insulin aspart (NOVOLOG) 100 UNIT/ML injection Inject 8 units after meals and Blood sugars greater than 150. 08/27/19   Grayce Sessions, NP  insulin glargine (LANTUS) 100 UNIT/ML  injection Inject 0.2 mLs (20 Units total) into the skin at bedtime. 08/27/19   Grayce Sessions, NP  metFORMIN (GLUCOPHAGE) 500 MG tablet Take 2 tablets (1,000 mg total) by mouth 2 (two) times daily with a meal. 08/27/19   Grayce Sessions, NP    Allergies    Cephalosporins and Lithium  Review of Systems   Review of Systems  Constitutional: Negative for chills and fever.  HENT: Negative for congestion and facial swelling.   Eyes: Negative for discharge and visual disturbance.  Respiratory: Negative for shortness of breath.   Cardiovascular: Negative for chest pain and palpitations.  Gastrointestinal: Negative for abdominal pain, diarrhea and vomiting.  Musculoskeletal: Negative for arthralgias and myalgias.  Skin: Negative for color change and rash.  Neurological: Negative for tremors, syncope and headaches.  Psychiatric/Behavioral: Positive for confusion. Negative for dysphoric mood.  49 yo   Physical Exam Updated Vital Signs BP 112/80   Pulse (!) 109   Temp 98.3 F (36.8 C) (Oral)   Resp 13   SpO2 96%   Physical Exam Vitals and nursing note reviewed.  Constitutional:      Appearance: He is well-developed.  HENT:     Head: Normocephalic and atraumatic.  Eyes:     Pupils: Pupils are equal, round, and reactive to light.  Neck:     Vascular: No JVD.  Cardiovascular:     Rate and Rhythm: Normal rate and regular rhythm.     Heart sounds: No  murmur. No friction rub. No gallop.   Pulmonary:     Effort: No respiratory distress.     Breath sounds: No wheezing.  Abdominal:     General: There is no distension.     Tenderness: There is no abdominal tenderness. There is no guarding or rebound.  Musculoskeletal:        General: Normal range of motion.     Cervical back: Normal range of motion and neck supple.  Skin:    Coloration: Skin is not pale.     Findings: No rash.  Neurological:     Mental Status: He is alert. He is confused.     Comments: Rotary nystagmus.   Otherwise benign neurologic exam.  Psychiatric:        Behavior: Behavior normal.     ED Results / Procedures / Treatments   Labs (all labs ordered are listed, but only abnormal results are displayed) Labs Reviewed  COMPREHENSIVE METABOLIC PANEL - Abnormal; Notable for the following components:      Result Value   Sodium 132 (*)    Chloride 93 (*)    CO2 20 (*)    Glucose, Bld 473 (*)    BUN 21 (*)    Total Protein 8.8 (*)    Albumin 5.1 (*)    Total Bilirubin 1.9 (*)    Anion gap 19 (*)    All other components within normal limits  URINALYSIS, ROUTINE W REFLEX MICROSCOPIC - Abnormal; Notable for the following components:   Color, Urine STRAW (*)    APPearance TURBID (*)    Specific Gravity, Urine 1.032 (*)    Glucose, UA >=500 (*)    Bilirubin Urine MODERATE (*)    Bacteria, UA RARE (*)    All other components within normal limits  BLOOD GAS, VENOUS - Abnormal; Notable for the following components:   Acid-base deficit 4.2 (*)    All other components within normal limits  CBG MONITORING, ED - Abnormal; Notable for the following components:   Glucose-Capillary 499 (*)    All other components within normal limits  CULTURE, BLOOD (ROUTINE X 2)  CULTURE, BLOOD (ROUTINE X 2)  SARS CORONAVIRUS 2 (TAT 6-24 HRS)  AMMONIA  ETHANOL  CBC WITH DIFFERENTIAL/PLATELET  RAPID URINE DRUG SCREEN, HOSP PERFORMED  LACTIC ACID, PLASMA  LACTIC ACID, PLASMA  VITAMIN B1  VITAMIN B12  FOLATE RBC  TSH  RPR  HIV ANTIBODY (ROUTINE TESTING W REFLEX)    EKG EKG Interpretation  Date/Time:  Sunday September 05 2019 23:36:36 EST Ventricular Rate:  110 PR Interval:    QRS Duration: 91 QT Interval:  344 QTC Calculation: 466 R Axis:   42 Text Interpretation: Sinus tachycardia LAE, consider biatrial enlargement No old tracing to compare Confirmed by Melene Plan [phone]) on 09/05/2019 11:43:05 PM   Radiology DG Chest 2 View  Result Date: 09/06/2019 CLINICAL DATA:  Altered level of  consciousness. EXAM: CHEST - 2 VIEW COMPARISON:  None. FINDINGS: The heart size and mediastinal contours are within normal limits. Both lungs are clear. The visualized skeletal structures are unremarkable. IMPRESSION: No active cardiopulmonary disease. Electronically Signed   By: Katherine Mantle M.D.   On: 09/06/2019 01:38   CT HEAD WO CONTRAST  Result Date: 09/06/2019 CLINICAL DATA:  Altered mental status (AMS), unclear cause EXAM: CT HEAD WITHOUT CONTRAST TECHNIQUE: Contiguous axial images were obtained from the base of the skull through the vertex without intravenous contrast. COMPARISON:  None. FINDINGS: Brain: Possible asymmetric low-density in  the inferior left cerebellum. Alternatively this may be related to artifact. No intracranial hemorrhage. No midline shift or mass effect. No hydrocephalus. Basilar cisterns are patent. Vascular: No hyperdense vessel. Skull: No fracture or focal lesion. Sinuses/Orbits: Tiny mucous retention cyst in the right maxillary sinus. Paranasal sinuses and mastoid air cells are otherwise clear. The visualized orbits are unremarkable. Other: None. IMPRESSION: Possible asymmetric low-density in the inferior left cerebellum which may represent ischemia, versus skull base artifact. Consider MRI for further evaluation. Otherwise negative head CT. Electronically Signed   By: Keith Rake M.D.   On: 09/06/2019 01:29    Procedures Procedures (including critical care time)  Medications Ordered in ED Medications  sodium chloride 0.9 % bolus 1,000 mL (1,000 mLs Intravenous New Bag/Given (Non-Interop) 09/06/19 0105)    ED Course  I have reviewed the triage vital signs and the nursing notes.  Pertinent labs & imaging results that were available during my care of the patient were reviewed by me and considered in my medical decision making (see chart for details).    MDM Rules/Calculators/A&P                      49 yo M with a chief complaint of altered mental  status.  He thinks going on just for today.  Has been having trouble controlling his blood sugars.  500 here on arrival.  Will obtain an altered mental status work-up as the patient is denying alcohol or illegal drugs.  He does have rotary nystagmus which is interesting.  Question of PCP use versus stroke.  I have discussed the case with Dr. Malen Gauze, neurology.  Felt that based on my description of the physical exam and the CT finding with possible cerebellar involvement recommended an MRI of the brain.  With his altered mental status and hyperglycemia he felt it was reasonable to discuss with medicine for medical admission.  Of note the patient also has metabolic acidosis with anion gap.  There are no ketones in the urine.  His alcohol level is normal.  Will discuss with medicine.  Discussed case with the hospitalist, brought up a good point about possible Warnicke's encephalopathy.  Will add a thiamine level.  The patients results and plan were reviewed and discussed.   Any x-rays performed were independently reviewed by myself.   Differential diagnosis were considered with the presenting HPI.  Medications  sodium chloride 0.9 % bolus 1,000 mL (1,000 mLs Intravenous New Bag/Given (Non-Interop) 09/06/19 0105)    Vitals:   09/06/19 0138 09/06/19 0200 09/06/19 0214 09/06/19 0230  BP: 127/84 120/75 120/75 112/80  Pulse: (!) 107 (!) 113 (!) 109 (!) 109  Resp: (!) 9  13   Temp:      TempSrc:      SpO2: 95% 95% 96% 96%    Final diagnoses:  Transient alteration of awareness    Admission/ observation were discussed with the admitting physician, patient and/or family and they are comfortable with the plan.    Final Clinical Impression(s) / ED Diagnoses Final diagnoses:  Transient alteration of awareness    Rx / DC Orders ED Discharge Orders    None       Deno Etienne, DO 09/06/19 0250

## 2019-09-06 NOTE — Progress Notes (Signed)
09/06/2019  0858  Called lab to have HGB A1C add to morning labs. Per Lab they can run the test.

## 2019-09-07 LAB — FOLATE RBC
Folate, Hemolysate: 512 ng/mL
Folate, RBC: 1213 ng/mL (ref >498)
Hematocrit: 42.2 % (ref 37.5–51.0)

## 2019-09-11 LAB — CULTURE, BLOOD (ROUTINE X 2)
Culture: NO GROWTH
Culture: NO GROWTH
Special Requests: ADEQUATE
Special Requests: ADEQUATE

## 2019-09-12 ENCOUNTER — Encounter (HOSPITAL_COMMUNITY): Payer: Self-pay | Admitting: Emergency Medicine

## 2019-09-12 ENCOUNTER — Other Ambulatory Visit: Payer: Self-pay

## 2019-09-12 ENCOUNTER — Emergency Department (HOSPITAL_COMMUNITY)
Admission: EM | Admit: 2019-09-12 | Discharge: 2019-09-13 | Disposition: A | Discharge status: Home / Self Care | Payer: Self-pay | Attending: Emergency Medicine | Admitting: Emergency Medicine

## 2019-09-12 DIAGNOSIS — T483X1A Poisoning by antitussives, accidental (unintentional), initial encounter: Secondary | ICD-10-CM | POA: Insufficient documentation

## 2019-09-12 DIAGNOSIS — Z79899 Other long term (current) drug therapy: Secondary | ICD-10-CM | POA: Insufficient documentation

## 2019-09-12 DIAGNOSIS — T50901A Poisoning by unspecified drugs, medicaments and biological substances, accidental (unintentional), initial encounter: Secondary | ICD-10-CM

## 2019-09-12 DIAGNOSIS — E119 Type 2 diabetes mellitus without complications: Secondary | ICD-10-CM | POA: Insufficient documentation

## 2019-09-12 LAB — CBC WITH DIFFERENTIAL/PLATELET
Abs Immature Granulocytes: 0.03 10*3/uL (ref 0.00–0.07)
Basophils Absolute: 0 10*3/uL (ref 0.0–0.1)
Basophils Relative: 0 %
Eosinophils Absolute: 0.1 10*3/uL (ref 0.0–0.5)
Eosinophils Relative: 1 %
HCT: 42.4 % (ref 39.0–52.0)
Hemoglobin: 14.1 g/dL (ref 13.0–17.0)
Immature Granulocytes: 0 %
Lymphocytes Relative: 32 %
Lymphs Abs: 2.9 10*3/uL (ref 0.7–4.0)
MCH: 32 pg (ref 26.0–34.0)
MCHC: 33.3 g/dL (ref 30.0–36.0)
MCV: 96.4 fL (ref 80.0–100.0)
Monocytes Absolute: 0.6 10*3/uL (ref 0.1–1.0)
Monocytes Relative: 7 %
Neutro Abs: 5.3 10*3/uL (ref 1.7–7.7)
Neutrophils Relative %: 60 %
Platelets: 241 10*3/uL (ref 150–400)
RBC: 4.4 MIL/uL (ref 4.22–5.81)
RDW: 13.6 % (ref 11.5–15.5)
WBC: 8.9 10*3/uL (ref 4.0–10.5)
nRBC: 0 % (ref 0.0–0.2)

## 2019-09-12 LAB — COMPREHENSIVE METABOLIC PANEL
ALT: 23 U/L (ref 0–44)
AST: 21 U/L (ref 15–41)
Albumin: 4.1 g/dL (ref 3.5–5.0)
Alkaline Phosphatase: 76 U/L (ref 38–126)
Anion gap: 12 (ref 5–15)
BUN: 10 mg/dL (ref 6–20)
CO2: 24 mmol/L (ref 22–32)
Calcium: 9.3 mg/dL (ref 8.9–10.3)
Chloride: 99 mmol/L (ref 98–111)
Creatinine, Ser: 0.69 mg/dL (ref 0.61–1.24)
GFR calc Af Amer: >60 mL/min (ref >60)
GFR calc non Af Amer: >60 mL/min (ref >60)
Glucose, Bld: 320 mg/dL — ABNORMAL HIGH (ref 70–99)
Potassium: 3.5 mmol/L (ref 3.5–5.1)
Sodium: 135 mmol/L (ref 135–145)
Total Bilirubin: 2.3 mg/dL — ABNORMAL HIGH (ref 0.3–1.2)
Total Protein: 7.4 g/dL (ref 6.5–8.1)

## 2019-09-12 LAB — LACTIC ACID, PLASMA
Lactic Acid, Venous: 2.6 mmol/L (ref 0.5–1.9)
Lactic Acid, Venous: 1.2 mmol/L (ref 0.5–1.9)

## 2019-09-12 LAB — SALICYLATE LEVEL: Salicylate Lvl: <7 mg/dL — ABNORMAL LOW (ref 7.0–30.0)

## 2019-09-12 LAB — CBG MONITORING, ED: Glucose-Capillary: 307 mg/dL — ABNORMAL HIGH (ref 70–99)

## 2019-09-12 LAB — ACETAMINOPHEN LEVEL: Acetaminophen (Tylenol), Serum: <10 ug/mL — ABNORMAL LOW (ref 10–30)

## 2019-09-12 MED ORDER — SODIUM CHLORIDE 0.9 % IV BOLUS (SEPSIS)
1000.0000 mL | Freq: Once | INTRAVENOUS | Status: AC
  Administered 2019-09-12: 1000 mL via INTRAVENOUS

## 2019-09-12 NOTE — ED Notes (Signed)
Date and time results received: 09/12/19 2047 (use smartphrase ".now" to insert current time)  Test:Lactic acid Critical Value: 2.6  Name of Provider Notified: Freida Busman MD  Orders Received? Or Actions Taken?: waiting on orders

## 2019-09-12 NOTE — ED Provider Notes (Addendum)
[name] Provider Note   CSN: [id number] Arrival date & time: 09/12/19  1935     History Chief Complaint  Patient presents with  . Drug Overdose    Eric Kane is a 49 y.o. male.  Patient is a 49 year old gentleman with past medical history of uncontrolled diabetes presenting to the emergency department for altered mental status.  Patient is lethargic and anxious slurred and is a level 5 caveat due to altered mental status.  He does report that he did take a whole bottle of some sort of cold medication which could be dextromethorphan.  Per his friend he may have also been ingesting hand sanitizer.  Patient had a recent admission for altered mental status in which the etiology was unknown.  He had extensive work-up and there was suspected hyperglycemic toxicity.        Past Medical History:  Diagnosis Date  . Chronic shoulder pain   . Diabetes mellitus [place])     Patient Active Problem List   Diagnosis Date Noted  . Acute encephalopathy 09/06/2019  . Insulin dependent type 2 diabetes mellitus, uncontrolled (HCC) 09/06/2019    History reviewed. No pertinent surgical history.     History reviewed. No pertinent family history.  Social History   Tobacco Use  . Smoking status: Never Smoker  . Smokeless tobacco: Current User    Types: Snuff  Substance Use Topics  . Alcohol use: Not Currently  . Drug use: Not Currently    Home Medications Prior to Admission medications   Medication Sig Start Date End Date Taking? Authorizing Provider  buPROPion (WELLBUTRIN XL) 150 MG 24 hr tablet Take 150 mg by mouth daily after breakfast. 08/17/19  Yes [provider]  insulin aspart (NOVOLOG) 100 UNIT/ML injection Inject 8 units after meals and Blood sugars greater than 150. 08/27/19  Yes Grayce Sessions, NP  lamoTRIgine (LAMICTAL) 200 MG tablet Take 200 mg by mouth at bedtime. 08/17/19  Yes [provider]  metFORMIN  (GLUCOPHAGE) 500 MG tablet Take 2 tablets (1,000 mg total) by mouth 2 (two) times daily with a meal. 08/27/19  Yes Grayce Sessions, NP  insulin glargine (LANTUS) 100 UNIT/ML injection Inject 0.2 mLs (20 Units total) into the skin at bedtime. Patient not taking: Reported on 09/06/2019 08/27/19   Grayce Sessions, NP    Allergies    Cephalosporins and Lithium  Review of Systems   Review of Systems  Unable to perform ROS: Mental status change    Physical Exam Updated Vital Signs BP 128/82 (BP Location: Left Arm)   Pulse 90   Temp 99.1 F (37.3 C) (Oral)   Resp 16   Ht 5\' 11"  (1.803 m)   Wt 113 kg   SpO2 96%   BMI 34.75 kg/m   Physical Exam Vitals and nursing note reviewed.  Constitutional:      General: He is not in acute distress.    Appearance: Normal appearance. He is obese. He is ill-appearing. He is not diaphoretic.  HENT:     Head: Normocephalic.     Nose: Nose normal.     Mouth/Throat:     Mouth: Mucous membranes are moist.  Eyes:     General:        Right eye: No discharge.        Left eye: No discharge.     Conjunctiva/sclera: Conjunctivae normal.     Comments: Glossy eyes PERRL  Cardiovascular:     Rate and  Rhythm: Tachycardia present.     Pulses: Normal pulses.     Heart sounds: Normal heart sounds.  Pulmonary:     Effort: Pulmonary effort is normal.     Breath sounds: Normal breath sounds.  Abdominal:     General: Abdomen is flat.     Palpations: Abdomen is soft.     Tenderness: There is no abdominal tenderness. There is no guarding.  Skin:    General: Skin is dry.     Capillary Refill: Capillary refill takes less than 2 seconds.  Neurological:     General: No focal deficit present.     Mental Status: He is alert. He is disoriented.     Comments: Follows commands, falls asleep if not engaged, slurred speech     ED Results / Procedures / Treatments   Labs (all labs ordered are listed, but only abnormal results are displayed) Labs Reviewed    COMPREHENSIVE METABOLIC PANEL - Abnormal; Notable for the following components:      Result Value   Glucose, Bld 320 (*)    Total Bilirubin 2.3 (*)    All other components within normal limits  URINALYSIS, ROUTINE W REFLEX MICROSCOPIC - Abnormal; Notable for the following components:   Color, Urine AMBER (*)    APPearance TURBID (*)    Glucose, UA >=500 (*)    Bilirubin Urine MODERATE (*)    Bacteria, UA RARE (*)    All other components within normal limits  LACTIC ACID, PLASMA - Abnormal; Notable for the following components:   Lactic Acid, Venous 2.6 (*)    All other components within normal limits  ACETAMINOPHEN LEVEL - Abnormal; Notable for the following components:   Acetaminophen (Tylenol), Serum <10 (*)    All other components within normal limits  SALICYLATE LEVEL - Abnormal; Notable for the following components:   Salicylate Lvl <7.0 (*)    All other components within normal limits  CBG MONITORING, ED - Abnormal; Notable for the following components:   Glucose-Capillary 307 (*)    All other components within normal limits  CBC WITH DIFFERENTIAL/PLATELET  LACTIC ACID, PLASMA  PATHOLOGIST SMEAR REVIEW    EKG EKG Interpretation  Date/Time:  Sunday September 12 2019 19:37:27 EST Ventricular Rate:  104 PR Interval:    QRS Duration: 85 QT Interval:  342 QTC Calculation: 450 R Axis:   64 Text Interpretation: Sinus tachycardia No significant change since last tracing Confirmed by Lorre Nick ([id number]) on 09/12/2019 8:49:26 PM   Radiology No results found.  Procedures Procedures (including critical care time)  Medications Ordered in ED Medications  sodium chloride 0.9 % bolus 1,000 mL (0 mLs Intravenous Stopped 09/12/19 2048)    ED Course  I have reviewed the triage vital signs and the nursing notes.  Pertinent labs & imaging results that were available during my care of the patient were reviewed by me and considered in my medical decision making (see chart for  details).  Clinical Course as of Sep 20 903  Wynelle Link Sep 12, 2019  2053 Patient presenting to ED for intentional ingestion of dextromethorphan. Patient lethargic but alert and oriented and protecting airway. Denies SI. Spoke with Patty from poison control and obtained recommended labs. Advised to observe in ED for 6 hours from arrival. Care passed to [facility] PA due to change of shift.   [KM]    Clinical Course User Index [KM] Jeral Pinch   MDM Rules/Calculators/A&P  Final Clinical Impression(s) / ED Diagnoses Final diagnoses:  Accidental drug overdose, initial encounter    Rx / DC Orders ED Discharge Orders    None       Alveria Apley, PA-C 09/12/19 2056    Alveria Apley, PA-C 09/20/19 8546    Lacretia Leigh, MD 09/20/19 1255

## 2019-09-12 NOTE — ED Notes (Signed)
Eric Kane ([facility]- resident advisor) [phone] Ok to call for information and for ride if pt is D/C

## 2019-09-12 NOTE — ED Provider Notes (Signed)
Overdose on Dextromethorphan and hand sanitizer "to get high" Recent admission for altered mental status with unknown etiology, ??hyperglycemic toxicity Can go after 6-hour observation. (1:40)  10:40 - Intro and recheck: patient is awake, oriented, slurred speech. Denies SI/HI/AVH. Understands observation period and how long he can expect to be here. All questions answered. VSS.  At 1:30 - patient is cleared for discharge. Sober living contacted and refuse to take the patient back. The patient has no where to go, is homeless. Will keep him in the ED til morning when he can have a PEER support consult. Can discharge at that time.    Elpidio Anis, PA-C 09/13/19 0556    Lorre Nick, MD 09/14/19 1550

## 2019-09-12 NOTE — Progress Notes (Signed)
Introduced myself and purpose for my presence and patient told me to get the f--- out that he does not need anything or any services from me.  Mr. Eric Kane made aware of patients refusal and was given verbal order to hold off on ABG for now.  Will await further orders.

## 2019-09-12 NOTE — ED Provider Notes (Signed)
Medical screening examination/treatment/procedure(s) were conducted as a shared visit with non-physician practitioner(s) and myself.  I personally evaluated the patient during the encounter.    49 year old male presents after intentional overdose of dextromethorphan, Mucinex.  Patient states he was trying become intoxicated.  Denies that this was a suicide attempt.  Patient to be observed here.  Will check labs for Tylenol toxicity   Lorre Nick, MD 09/12/19 [phone]

## 2019-09-12 NOTE — ED Triage Notes (Signed)
Pt presents for evaluation of intentional overdose of Mucinex due to being board. Pt also has hx of DM and per friend glucose levels have been running in the 400s.

## 2019-09-12 NOTE — ED Triage Notes (Signed)
Pt denies wanting to harm self or others. Friend that transported pt in advised that pt may have been drinking hand sanitizer.

## 2019-09-13 LAB — URINALYSIS, ROUTINE W REFLEX MICROSCOPIC
Glucose, UA: >=500 mg/dL — AB
Hgb urine dipstick: NEGATIVE
Ketones, ur: NEGATIVE mg/dL
Leukocytes,Ua: NEGATIVE
Nitrite: NEGATIVE
Protein, ur: NEGATIVE mg/dL
Specific Gravity, Urine: 1.024 (ref 1.005–1.030)
pH: 5 (ref 5.0–8.0)

## 2019-09-13 LAB — PATHOLOGIST SMEAR REVIEW

## 2019-09-13 LAB — VITAMIN B1: Vitamin B1 (Thiamine): 175.4 nmol/L (ref 66.5–200.0)

## 2019-09-13 NOTE — Patient Outreach (Signed)
ED Peer Support Specialist Patient Intake (Complete at intake & 30-60 Day Follow-up)  Name: Eric Kane  MRN: [id number]  Age: 49 y.o.   Date of Admission: 09/13/2019  Intake: Initial Comments:      Primary Reason Admitted: Accidental drug overdose, initial encounter   Lab values: Alcohol/ETOH: Not completed Positive UDS? Drug screen not completed Amphetamines: Drug screen not completed Barbiturates: Drug screen not completed Benzodiazepines: Drug screen not completed Cocaine: Drug screen not completed Opiates: Drug screen not completed Cannabinoids: Drug screen not completed  Demographic information: Gender: Male Ethnicity: White Marital Status: Single Insurance Status: Uninsured/Self-pay Ecologist (Work Neurosurgeon, Physicist, medical, etc.: No Lives with: Alone Living situation: Homeless shelter  Reported Patient History: Patient reported health conditions: Diabetes, Depression Patient aware of HIV and hepatitis status: No  In past year, has patient visited ED for any reason? No  Number of ED visits:    Reason(s) for visit:    In past year, has patient been hospitalized for any reason? No  Number of hospitalizations:    Reason(s) for hospitalization:    In past year, has patient been arrested? No  Number of arrests:    Reason(s) for arrest:    In past year, has patient been incarcerated? No  Number of incarcerations:    Reason(s) for incarceration:    In past year, has patient received medication-assisted treatment? No  In past year, patient received the following treatments: Other (comment)([facility])  In past year, has patient received any harm reduction services? No  Did this include any of the following?    In past year, has patient received care from a mental health provider for diagnosis other than SUD? Yes  In past year, is this first time patient has overdosed? No  Number of past overdoses: 3  In  past year, is this first time patient has been hospitalized for an overdose? Yes  Number of hospitalizations for overdose(s):    Is patient currently receiving treatment for a mental health diagnosis? Yes  Patient reports experiencing difficulty participating in SUD treatment: Yes    Most important reason(s) for this difficulty? Lack of stable housing  Has patient received prior services for treatment? No  In past, patient has received services from following agencies:    Plan of Care:  Suggested follow up at these agencies/treatment centers: Other (comment)(Wants to continue using [facility])  Other information: CPSS met with Pt an was able to gain information to better assist Pt. CPSS was made aware that Pt was kicked out of [facility]. CPSS issued resource information for Out Patient services as well as Homeless Shelter resources.    Aaron Edelman Tiahna Cure, CPSS  09/13/2019 3:59 AM

## 2019-09-13 NOTE — ED Notes (Signed)
PT DISCHARGED. INSTRUCTIONS GIVEN. AAOX4. PT IN NO APPARENT DISTRESS OR PAIN. THE OPPORTUNITY TO ASK QUESTIONS WAS PROVIDED. 

## 2019-09-13 NOTE — ED Notes (Signed)
Talked with Pattie from poison control. Current plan to continue.

## 2019-09-13 NOTE — Discharge Instructions (Addendum)
Please use the resource guide to find housing. You may go to the [facility] for help at [address] (opens at 8am) You may [facility] at [address] (open 24 hours). [place]. Bus rides are free. Peer support will reach you by phone.

## 2019-09-13 NOTE — ED Provider Notes (Signed)
6:51 AM Patient taken in sign out from [facility] [place]. Patient with unintentional overdose. Not suprisingly, sober living will no longer take him.  He states he has no where to go. Patient awaiting peer support.  Patient safe to discharge. Given resources including the address of [facility] the [place] and the [facility].  Family services is open 24 hours a day, the [facility] opens at 8 AM.  But signs are currently free.Appears appropriate for discharge    Delos Haring 09/13/19 0708    Glynn Octave, MD 09/13/19 1715

## 2019-12-15 ENCOUNTER — Telehealth (INDEPENDENT_AMBULATORY_CARE_PROVIDER_SITE_OTHER): Payer: Self-pay

## 2019-12-15 NOTE — Telephone Encounter (Signed)
Left message asking patient to return call to [facility] at [phone].  

## 2019-12-15 NOTE — Telephone Encounter (Signed)
[facility] CALLED TO REQUEST metFORMIN (GLUCOPHAGE) 500 MG tablet   PLEASE SEND MEDICATION TO [facility] IN [place]  PHONE: [phone] FAX: [phone]

## 2020-02-28 ENCOUNTER — Encounter (HOSPITAL_COMMUNITY): Payer: Self-pay

## 2020-02-28 ENCOUNTER — Emergency Department (HOSPITAL_COMMUNITY)
Admission: EM | Admit: 2020-02-28 | Discharge: 2020-02-28 | Disposition: A | Discharge status: Home / Self Care | Payer: 59 | Attending: Emergency Medicine | Admitting: Emergency Medicine

## 2020-02-28 ENCOUNTER — Other Ambulatory Visit: Payer: Self-pay

## 2020-02-28 DIAGNOSIS — X088XXA Exposure to other specified smoke, fire and flames, initial encounter: Secondary | ICD-10-CM | POA: Diagnosis not present

## 2020-02-28 DIAGNOSIS — T310 Burns involving less than 10% of body surface: Secondary | ICD-10-CM | POA: Diagnosis not present

## 2020-02-28 DIAGNOSIS — T24012A Burn of unspecified degree of left thigh, initial encounter: Secondary | ICD-10-CM | POA: Insufficient documentation

## 2020-02-28 DIAGNOSIS — Z794 Long term (current) use of insulin: Secondary | ICD-10-CM | POA: Insufficient documentation

## 2020-02-28 DIAGNOSIS — Y929 Unspecified place or not applicable: Secondary | ICD-10-CM | POA: Diagnosis not present

## 2020-02-28 DIAGNOSIS — Y939 Activity, unspecified: Secondary | ICD-10-CM | POA: Diagnosis not present

## 2020-02-28 DIAGNOSIS — F17290 Nicotine dependence, other tobacco product, uncomplicated: Secondary | ICD-10-CM | POA: Insufficient documentation

## 2020-02-28 DIAGNOSIS — T24002A Burn of unspecified degree of unspecified site of left lower limb, except ankle and foot, initial encounter: Secondary | ICD-10-CM

## 2020-02-28 DIAGNOSIS — Y999 Unspecified external cause status: Secondary | ICD-10-CM | POA: Insufficient documentation

## 2020-02-28 HISTORY — DX: Anxiety disorder, unspecified: F41.9

## 2020-02-28 MED ORDER — SULFAMETHOXAZOLE-TRIMETHOPRIM 800-160 MG PO TABS: 1.0000 | ORAL_TABLET | Freq: Two times a day (BID) | ORAL | 0 refills | Status: DC
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
## 2020-02-28 NOTE — Discharge Instructions (Signed)
Please return for any problem.  Follow-up with your regular care provider as instructed.  Wound clean and dry as instructed.  Use Bactrim as instructed.

## 2020-03-01 ENCOUNTER — Other Ambulatory Visit: Payer: Self-pay

## 2020-03-01 ENCOUNTER — Encounter (HOSPITAL_COMMUNITY): Payer: Self-pay | Admitting: *Deleted

## 2020-03-01 ENCOUNTER — Emergency Department (HOSPITAL_COMMUNITY)
Admission: EM | Admit: 2020-03-01 | Discharge: 2020-03-01 | Disposition: A | Discharge status: Home / Self Care | Payer: 59 | Attending: Emergency Medicine | Admitting: Emergency Medicine

## 2020-03-01 DIAGNOSIS — T368X5A Adverse effect of other systemic antibiotics, initial encounter: Secondary | ICD-10-CM | POA: Insufficient documentation

## 2020-03-01 DIAGNOSIS — T887XXA Unspecified adverse effect of drug or medicament, initial encounter: Secondary | ICD-10-CM

## 2020-03-01 DIAGNOSIS — Z794 Long term (current) use of insulin: Secondary | ICD-10-CM | POA: Insufficient documentation

## 2020-03-01 DIAGNOSIS — F17290 Nicotine dependence, other tobacco product, uncomplicated: Secondary | ICD-10-CM | POA: Diagnosis not present

## 2020-03-01 DIAGNOSIS — E119 Type 2 diabetes mellitus without complications: Secondary | ICD-10-CM | POA: Diagnosis not present

## 2020-03-01 DIAGNOSIS — R112 Nausea with vomiting, unspecified: Secondary | ICD-10-CM | POA: Insufficient documentation

## 2020-03-01 DIAGNOSIS — Z0279 Encounter for issue of other medical certificate: Secondary | ICD-10-CM | POA: Diagnosis present

## 2020-03-01 MED ORDER — DOXYCYCLINE HYCLATE 100 MG PO CAPS: 100.0000 mg | ORAL_CAPSULE | Freq: Two times a day (BID) | ORAL | 0 refills | Status: DC

## 2020-03-01 NOTE — ED Notes (Signed)
Verbalized understanding discharge instructions. In no acute distress.   
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
## 2020-03-01 NOTE — ED Notes (Signed)
PT provided urine sample cup.
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
## 2020-05-04 ENCOUNTER — Encounter (HOSPITAL_COMMUNITY): Payer: Self-pay

## 2020-05-04 ENCOUNTER — Emergency Department (HOSPITAL_COMMUNITY)
Admission: EM | Admit: 2020-05-04 | Discharge: 2020-05-04 | Disposition: A | Discharge status: Home / Self Care | Payer: 59 | Attending: Emergency Medicine | Admitting: Emergency Medicine

## 2020-05-04 ENCOUNTER — Other Ambulatory Visit: Payer: Self-pay

## 2020-05-04 DIAGNOSIS — R739 Hyperglycemia, unspecified: Secondary | ICD-10-CM | POA: Diagnosis not present

## 2020-05-04 DIAGNOSIS — Z794 Long term (current) use of insulin: Secondary | ICD-10-CM | POA: Insufficient documentation

## 2020-05-04 DIAGNOSIS — F17290 Nicotine dependence, other tobacco product, uncomplicated: Secondary | ICD-10-CM | POA: Diagnosis not present

## 2020-05-04 LAB — CBG MONITORING, ED: Glucose-Capillary: 379 mg/dL — ABNORMAL HIGH (ref 70–99)

## 2020-05-04 LAB — CBC
HCT: 43.6 % (ref 39.0–52.0)
Hemoglobin: 14.9 g/dL (ref 13.0–17.0)
MCH: 31.4 pg (ref 26.0–34.0)
MCHC: 34.2 g/dL (ref 30.0–36.0)
MCV: 92 fL (ref 80.0–100.0)
Platelets: 323 10*3/uL (ref 150–400)
RBC: 4.74 MIL/uL (ref 4.22–5.81)
RDW: 13.3 % (ref 11.5–15.5)
WBC: 11.8 10*3/uL — ABNORMAL HIGH (ref 4.0–10.5)
nRBC: 0 % (ref 0.0–0.2)

## 2020-05-04 LAB — BLOOD GAS, VENOUS
Acid-Base Excess: 1 mmol/L (ref 0.0–2.0)
Bicarbonate: 26.3 mmol/L (ref 20.0–28.0)
FIO2: 21
O2 Saturation: 74 %
Patient temperature: 98.6
pCO2, Ven: 46.2 mmHg (ref 44.0–60.0)
pH, Ven: 7.373 (ref 7.250–7.430)
pO2, Ven: 46.8 mmHg — ABNORMAL HIGH (ref 32.0–45.0)

## 2020-05-04 LAB — BASIC METABOLIC PANEL
Anion gap: 12 (ref 5–15)
BUN: 18 mg/dL (ref 6–20)
CO2: 24 mmol/L (ref 22–32)
Calcium: 10.6 mg/dL — ABNORMAL HIGH (ref 8.9–10.3)
Chloride: 96 mmol/L — ABNORMAL LOW (ref 98–111)
Creatinine, Ser: 1.11 mg/dL (ref 0.61–1.24)
GFR calc Af Amer: >60 mL/min (ref >60)
GFR calc non Af Amer: >60 mL/min (ref >60)
Glucose, Bld: 314 mg/dL — ABNORMAL HIGH (ref 70–99)
Potassium: 3.7 mmol/L (ref 3.5–5.1)
Sodium: 132 mmol/L — ABNORMAL LOW (ref 135–145)

## 2020-05-04 MED ORDER — SODIUM CHLORIDE 0.9 % IV BOLUS
1000.0000 mL | Freq: Once | INTRAVENOUS | Status: AC
  Administered 2020-05-04: 1000 mL via INTRAVENOUS

## 2020-05-04 MED ORDER — GLIMEPIRIDE 2 MG PO TABS: 2.0000 mg | ORAL_TABLET | Freq: Every day | ORAL | 1 refills | Status: AC

## 2020-05-04 NOTE — ED Provider Notes (Signed)
[place] DEPT Provider Note   CSN: [id number] Arrival date & time: 05/04/20  8099     History Chief Complaint  Patient presents with  . Hyperglycemia    Eric Kane is a 49 y.o. male.  He has a history of diabetes and takes Metformin and NovoLog.  He used to be on Lantus but due to cost has not been taking it.  Yesterday he felt very fatigued and found that his blood sugar was in the 350s.  Work sent him home and he cannot return until he has a note saying that he safe for work.  He said his blood sugars usually are 150s and he uses 8 units of insulin after meals.  Usually 3 times a day.  Has times where sugars are very low due to exertion at work and carries glucose tablets with him.  Denies any recent illness.  Does not drink alcohol or use drugs over the last 2 months.  The history is provided by the patient.  Hyperglycemia Blood sugar level PTA:  350 Severity:  Moderate Onset quality:  Unable to specify Duration:  2 days Progression:  Unchanged Chronicity:  Recurrent Diabetes status:  Controlled with insulin and controlled with oral medications Context: change in medication   Relieved by:  Nothing Ineffective treatments:  None tried Associated symptoms: fatigue and weight change (weight up)   Associated symptoms: no abdominal pain, no blurred vision, no chest pain, no confusion, no dehydration, no dysuria, no fever, no shortness of breath and no vomiting        Past Medical History:  Diagnosis Date  . Anxiety   . Chronic shoulder pain   . Diabetes mellitus [facility])     Patient Active Problem List   Diagnosis Date Noted  . Acute encephalopathy 09/06/2019  . Insulin dependent type 2 diabetes mellitus, uncontrolled ([place]) 09/06/2019    Past Surgical History:  Procedure Laterality Date  . ADENOIDECTOMY    . DENTAL SURGERY    . TONSILLECTOMY    . tubes in ears         Family History  Problem Relation Age of Onset  . Anxiety  disorder Mother   . Hypertension Mother   . Diabetes Mother   . Depression Mother   . High Cholesterol Father   . Anxiety disorder Father     Social History   Tobacco Use  . Smoking status: Current Some Day Smoker    Types: Cigars  . Smokeless tobacco: Current User    Types: Snuff  Vaping Use  . Vaping Use: Never used  Substance Use Topics  . Alcohol use: Not Currently  . Drug use: Not Currently    Home Medications Prior to Admission medications   Medication Sig Start Date End Date Taking? Authorizing Provider  buPROPion (WELLBUTRIN XL) 150 MG 24 hr tablet Take 150 mg by mouth daily after breakfast. 08/17/19   [provider]  doxycycline (VIBRAMYCIN) 100 MG capsule Take 1 capsule (100 mg total) by mouth 2 (two) times daily. One po bid x 7 days 03/01/20   Domenic Moras, PA-C  insulin aspart (NOVOLOG) 100 UNIT/ML injection Inject 8 units after meals and Blood sugars greater than 150. 08/27/19   Kerin Perna, NP  insulin glargine (LANTUS) 100 UNIT/ML injection Inject 0.2 mLs (20 Units total) into the skin at bedtime. Patient not taking: Reported on 09/06/2019 08/27/19   Kerin Perna, NP  lamoTRIgine (LAMICTAL) 200 MG tablet Take 200 mg by mouth  at bedtime. 08/17/19   [provider]  metFORMIN (GLUCOPHAGE) 500 MG tablet Take 2 tablets (1,000 mg total) by mouth 2 (two) times daily with a meal. 08/27/19   Kerin Perna, NP    Allergies    Cephalosporins and Lithium  Review of Systems   Review of Systems  Constitutional: Positive for fatigue. Negative for fever.  HENT: Negative for sore throat.   Eyes: Negative for blurred vision and visual disturbance.  Respiratory: Negative for shortness of breath.   Cardiovascular: Negative for chest pain.  Gastrointestinal: Negative for abdominal pain and vomiting.  Genitourinary: Negative for dysuria.  Musculoskeletal: Negative for gait problem.  Skin: Negative for rash.  Neurological: Negative for  headaches.  Psychiatric/Behavioral: Negative for confusion.    Physical Exam Updated Vital Signs BP (!) 133/96 (BP Location: Left Arm)   Pulse (!) 107   Temp 98.3 F (36.8 C) (Oral)   Resp 16   Ht 5' 10"  (1.778 m)   Wt 115.7 kg   SpO2 97%   BMI 36.59 kg/m   Physical Exam Vitals and nursing note reviewed.  Constitutional:      Appearance: Normal appearance. He is well-developed. He is not ill-appearing.  HENT:     Head: Normocephalic and atraumatic.  Eyes:     Conjunctiva/sclera: Conjunctivae normal.  Cardiovascular:     Rate and Rhythm: Regular rhythm. Tachycardia present.     Heart sounds: No murmur heard.   Pulmonary:     Effort: Pulmonary effort is normal. No respiratory distress.     Breath sounds: Normal breath sounds.  Abdominal:     Palpations: Abdomen is soft.     Tenderness: There is no abdominal tenderness.  Musculoskeletal:        General: No deformity or signs of injury. Normal range of motion.     Cervical back: Neck supple.  Skin:    General: Skin is warm and dry.     Capillary Refill: Capillary refill takes less than 2 seconds.  Neurological:     General: No focal deficit present.     Mental Status: He is alert.     Gait: Gait normal.     ED Results / Procedures / Treatments   Labs (all labs ordered are listed, but only abnormal results are displayed) Labs Reviewed  BASIC METABOLIC PANEL - Abnormal; Notable for the following components:      Result Value   Sodium 132 (*)    Chloride 96 (*)    Glucose, Bld 314 (*)    Calcium 10.6 (*)    All other components within normal limits  CBC - Abnormal; Notable for the following components:   WBC 11.8 (*)    All other components within normal limits  BLOOD GAS, VENOUS - Abnormal; Notable for the following components:   pO2, Ven 46.8 (*)    All other components within normal limits  CBG MONITORING, ED - Abnormal; Notable for the following components:   Glucose-Capillary 379 (*)    All other  components within normal limits  CBG MONITORING, ED    EKG None  Radiology No results found.  Procedures Procedures (including critical care time)  Medications Ordered in ED Medications  sodium chloride 0.9 % bolus 1,000 mL (0 mLs Intravenous Stopped 05/04/20 1123)    ED Course  I have reviewed the triage vital signs and the nursing notes.  Pertinent labs & imaging results that were available during my care of the patient were reviewed by me  and considered in my medical decision making (see chart for details).  Clinical Course as of May 04 1944  Thu May 04, 2020  1015 Reviewed case with diabetic coordinator. Recommended Amaryl 18m po qd if cost is an issue with restarting Lantus.    [MB]  1028 Diabetic coronary met with the patient and her recommendations are for the patient to stop his subcu regular insulin and see how her sugars do just on Metformin and Amaryl.  Patient agreeable to plan.  Has follow-up next week with his PCP.   [MB]    Clinical Course User Index [MB] BHayden Rasmussen MD   MDM Rules/Calculators/A&P                         This patient complains of hyperglycemia fatigue; this involves an extensive number of treatment Options and is a complaint that carries with it a high risk of complications and Morbidity. The differential includes hyperglycemia, DKA, metabolic derangement, infection, dehydration, renal failure  I ordered, reviewed and interpreted labs, which included CBC with mildly elevated white count, normal hemoglobin, chemistries with low sodium pseudohyponatremia, elevated glucose, normal, VBG with normal pH, doubt DKA I ordered medication IV fluids I consulted diabetic coordinator and discussed lab and imaging findings.  They recommended starting the patient on Amaryl and holding insulin.  Critical Interventions: None  After the interventions stated above, I reevaluated the patient and found patient to be feeling improved after IV fluids.  He  is in agreement with current plan for holding the insulin starting Amaryl and following his blood sugars.  Has PCP follow-up next week.  Return instructions discussed.  I filled out paperwork for his work saying that he can return to duty full-time with no restrictions.  Final Clinical Impression(s) / ED Diagnoses Final diagnoses:  Hyperglycemia    Rx / DC Orders ED Discharge Orders    None       BHayden Rasmussen MD 05/04/20 1948

## 2020-05-04 NOTE — ED Triage Notes (Addendum)
Patient states he CBG at home today was 345. CBG in triage-379. Patient denies any N/v or abdominal pain.

## 2020-05-04 NOTE — Progress Notes (Signed)
Inpatient Diabetes Program Recommendations  AACE/ADA: New Consensus Statement on Inpatient Glycemic Control (2015)  Target Ranges:  Prepandial:   less than 140 mg/dL      Peak postprandial:   less than 180 mg/dL (1-2 hours)      Critically ill patients:  140 - 180 mg/dL   Lab Results  Component Value Date   GLUCAP 379 (H) 05/04/2020   HGBA1C 12.2 (H) 09/06/2019    Review of Glycemic Control Results for Eric Kane, Eric Kane (MRN [id number]) as of 05/04/2020 09:59  Ref. Range 05/04/2020 08:30  Glucose-Capillary Latest Ref Range: 70 - 99 mg/dL 194 (H)   Diabetes history: Type 2 DM Outpatient Diabetes medications: Metformin 500 mg BID, Novolog SSI, Lantus 20 units QHS (not taking) Current orders for Inpatient glycemic control: none  Inpatient Diabetes Program Recommendations:    Received page/consult.  Patient has been seen by outpatient PCP for diabetes on 04/22/2020 and A1C at that time was 7.9%, down from 12.2%.  Given admit CBG was 379 mg/dL, consider adding Novolog 0-9 units TID & Hs if to be admitted or continue with home sliding scale.  Patient has been on Lantus in the past, however, was having frequent lows so discontinued taking. Could benefit from reduced dose: Lantus 10 units QD.   Patient plans to follow up with PCP on 9/20. If patient is uncomfortable with Lantus until follow up could consider Amaryl 2 mg QD. Secure chat sent to Md. Carollee Herter, diabetes coordinator at bedside to establish further plan.   Thanks, Lujean Rave, MSN, RNC-OB Diabetes Coordinator [phone] (8a-5p)

## 2020-05-04 NOTE — Discharge Instructions (Signed)
You were seen in the emergency department for evaluation of elevated blood sugar and fatigue.  Your sugar was in the mid 300s but your other lab work did not show any signs of DKA.  You met with the diabetic coordinator and their recommendation is that you stop your insulin and start on a new oral medication Amaryl once a day.  Please continue to check your blood sugars.  Follow-up with your primary care doctor next week as scheduled.  Return to the emergency department if any worsening or concerning symptoms.

## 2020-05-17 ENCOUNTER — Other Ambulatory Visit: Payer: Self-pay

## 2020-05-17 ENCOUNTER — Encounter (HOSPITAL_COMMUNITY): Payer: Self-pay

## 2020-05-17 ENCOUNTER — Emergency Department (HOSPITAL_COMMUNITY)
Admission: EM | Admit: 2020-05-17 | Discharge: 2020-05-17 | Disposition: A | Discharge status: Home / Self Care | Payer: 59 | Attending: Emergency Medicine | Admitting: Emergency Medicine

## 2020-05-17 DIAGNOSIS — E1165 Type 2 diabetes mellitus with hyperglycemia: Secondary | ICD-10-CM | POA: Diagnosis not present

## 2020-05-17 DIAGNOSIS — Z794 Long term (current) use of insulin: Secondary | ICD-10-CM | POA: Diagnosis not present

## 2020-05-17 DIAGNOSIS — Z5321 Procedure and treatment not carried out due to patient leaving prior to being seen by health care provider: Secondary | ICD-10-CM | POA: Diagnosis not present

## 2020-05-17 DIAGNOSIS — Z7984 Long term (current) use of oral hypoglycemic drugs: Secondary | ICD-10-CM | POA: Diagnosis not present

## 2020-05-17 LAB — CBC
HCT: 41.1 % (ref 39.0–52.0)
Hemoglobin: 14.2 g/dL (ref 13.0–17.0)
MCH: 31.8 pg (ref 26.0–34.0)
MCHC: 34.5 g/dL (ref 30.0–36.0)
MCV: 92.2 fL (ref 80.0–100.0)
Platelets: 313 10*3/uL (ref 150–400)
RBC: 4.46 MIL/uL (ref 4.22–5.81)
RDW: 13.2 % (ref 11.5–15.5)
WBC: 8.5 10*3/uL (ref 4.0–10.5)
nRBC: 0 % (ref 0.0–0.2)

## 2020-05-17 LAB — BASIC METABOLIC PANEL
Anion gap: 12 (ref 5–15)
BUN: 13 mg/dL (ref 6–20)
CO2: 29 mmol/L (ref 22–32)
Calcium: 11 mg/dL — ABNORMAL HIGH (ref 8.9–10.3)
Chloride: 95 mmol/L — ABNORMAL LOW (ref 98–111)
Creatinine, Ser: 1 mg/dL (ref 0.61–1.24)
GFR calc Af Amer: >60 mL/min (ref >60)
GFR calc non Af Amer: >60 mL/min (ref >60)
Glucose, Bld: 249 mg/dL — ABNORMAL HIGH (ref 70–99)
Potassium: 4.4 mmol/L (ref 3.5–5.1)
Sodium: 136 mmol/L (ref 135–145)

## 2020-05-17 LAB — CBG MONITORING, ED: Glucose-Capillary: 379 mg/dL — ABNORMAL HIGH (ref 70–99)

## 2020-05-17 NOTE — ED Triage Notes (Signed)
Pt presents with c/o hyperglycemia. Pt reports his MD recently took him off of insulin and placed him on metformin. Pt reports his CBG's at home have been 600 or higher. Pt reports he has been taking insulin at home for his blood sugar as well as metformin to try to decrease his blood sugar. Pt reports blurred vision at this time that has been present for one week since the blood sugars have increased.

## 2020-05-17 NOTE — ED Notes (Signed)
Pt eloped from waiting area.  

## 2020-05-18 ENCOUNTER — Emergency Department (HOSPITAL_COMMUNITY)
Admission: EM | Admit: 2020-05-18 | Discharge: 2020-05-18 | Disposition: A | Discharge status: Home / Self Care | Payer: 59 | Attending: Emergency Medicine | Admitting: Emergency Medicine

## 2020-05-18 ENCOUNTER — Other Ambulatory Visit: Payer: Self-pay

## 2020-05-18 ENCOUNTER — Encounter (HOSPITAL_COMMUNITY): Payer: Self-pay

## 2020-05-18 DIAGNOSIS — E1165 Type 2 diabetes mellitus with hyperglycemia: Secondary | ICD-10-CM | POA: Insufficient documentation

## 2020-05-18 DIAGNOSIS — R000 Tachycardia, unspecified: Secondary | ICD-10-CM | POA: Insufficient documentation

## 2020-05-18 DIAGNOSIS — Z794 Long term (current) use of insulin: Secondary | ICD-10-CM | POA: Diagnosis not present

## 2020-05-18 DIAGNOSIS — Z87891 Personal history of nicotine dependence: Secondary | ICD-10-CM | POA: Insufficient documentation

## 2020-05-18 DIAGNOSIS — R739 Hyperglycemia, unspecified: Secondary | ICD-10-CM | POA: Diagnosis present

## 2020-05-18 LAB — URINALYSIS, ROUTINE W REFLEX MICROSCOPIC
Bacteria, UA: NONE SEEN
Bilirubin Urine: NEGATIVE
Glucose, UA: >=500 mg/dL — AB
Hgb urine dipstick: NEGATIVE
Ketones, ur: NEGATIVE mg/dL
Leukocytes,Ua: NEGATIVE
Nitrite: NEGATIVE
Protein, ur: NEGATIVE mg/dL
Specific Gravity, Urine: 1.025 (ref 1.005–1.030)
pH: 6 (ref 5.0–8.0)

## 2020-05-18 LAB — BASIC METABOLIC PANEL
Anion gap: 12 (ref 5–15)
BUN: 11 mg/dL (ref 6–20)
CO2: 23 mmol/L (ref 22–32)
Calcium: 9.5 mg/dL (ref 8.9–10.3)
Chloride: 94 mmol/L — ABNORMAL LOW (ref 98–111)
Creatinine, Ser: 1.06 mg/dL (ref 0.61–1.24)
GFR calc Af Amer: >60 mL/min (ref >60)
GFR calc non Af Amer: >60 mL/min (ref >60)
Glucose, Bld: 576 mg/dL (ref 70–99)
Potassium: 4.4 mmol/L (ref 3.5–5.1)
Sodium: 129 mmol/L — ABNORMAL LOW (ref 135–145)

## 2020-05-18 LAB — CBC
HCT: 36.6 % — ABNORMAL LOW (ref 39.0–52.0)
Hemoglobin: 12.7 g/dL — ABNORMAL LOW (ref 13.0–17.0)
MCH: 31.8 pg (ref 26.0–34.0)
MCHC: 34.7 g/dL (ref 30.0–36.0)
MCV: 91.5 fL (ref 80.0–100.0)
Platelets: 262 10*3/uL (ref 150–400)
RBC: 4 MIL/uL — ABNORMAL LOW (ref 4.22–5.81)
RDW: 13.1 % (ref 11.5–15.5)
WBC: 7.9 10*3/uL (ref 4.0–10.5)
nRBC: 0 % (ref 0.0–0.2)

## 2020-05-18 LAB — CBG MONITORING, ED
Glucose-Capillary: >600 mg/dL (ref 70–99)
Glucose-Capillary: 589 mg/dL (ref 70–99)
Glucose-Capillary: 467 mg/dL — ABNORMAL HIGH (ref 70–99)
Glucose-Capillary: 377 mg/dL — ABNORMAL HIGH (ref 70–99)
Glucose-Capillary: 299 mg/dL — ABNORMAL HIGH (ref 70–99)

## 2020-05-18 MED ORDER — LACTATED RINGERS IV BOLUS
1000.0000 mL | Freq: Once | INTRAVENOUS | Status: AC
  Administered 2020-05-18: 1000 mL via INTRAVENOUS

## 2020-05-18 MED ORDER — SODIUM CHLORIDE 0.9 % IV BOLUS
1000.0000 mL | Freq: Once | INTRAVENOUS | Status: AC
  Administered 2020-05-18: 1000 mL via INTRAVENOUS

## 2020-05-18 MED ORDER — INSULIN ASPART 100 UNIT/ML ~~LOC~~ SOLN
15.0000 [IU] | Freq: Once | SUBCUTANEOUS | Status: AC
  Administered 2020-05-18: 15 [IU] via SUBCUTANEOUS
  Filled 2020-05-18: qty 0.15

## 2020-05-18 NOTE — Discharge Instructions (Addendum)
Great to see you today! Your sugars have been very high because your diabetes is uncontrolled. We hydrated you with IV fluids. Please continue metformin, glimepiride, and sliding scale with Novolog. Drink plenty of fluids over the weekend.  Please come back to the ED if your sugars go high again>600.  Follow up with your PCP on Monday. Please change your diet as we discussed with less sugary foods and fast foods.  Best wishes,  Dr Allena Katz

## 2020-05-18 NOTE — ED Triage Notes (Signed)
Patient states his glucometer at home was reading HI. Patient states he was here yesterday for the same, but left due to long waits. CBG in triage- HI. Patient denies any abdominal pain or N/V.

## 2020-05-18 NOTE — ED Provider Notes (Signed)
[facility]-EMERGENCY DEPT Provider Note   CSN: [id number] Arrival date & time: 05/18/20  7425     History No chief complaint on file.   Eric Kane is a 49 y.o. male.  Eric Kane is a 49 year old male with past medical history of insulin-dependent diabetes, HLD, depression, substance abuse acute encephalopathy, shoulder pain, anxiety who presents with CBG reading> 600. Patient came to the ED yesterday but due to long waits left. Patient has had high CBG readings for 3 weeks. He has recently been very stressed due to recent job changes. Diet is "not good" and eats processed foods, chips, sandwiches, sodas and sugary foods. So far taking metformin 1000mg  and glimepride 4mg  today. Associated symptoms include polyruia, polydipsia, fatigue, blurred vision. Last A1c was 7.8. last saw PCP 1.5 weeks ago. His next follow up with PCP Dr [facility] is next Monday.  Patient takes Metformin 1000 mg twice daily, glimepride 4mg  daily, (+-/ NovoLog 8 units when CBG> 150).  Lives in a group rehab home. Denies recent ETOH or substance abuse. His family live in [place].         Past Medical History:  Diagnosis Date  . Anxiety   . Chronic shoulder pain   . Diabetes mellitus [facility])     Patient Active Problem List   Diagnosis Date Noted  . Acute encephalopathy 09/06/2019  . Insulin dependent type 2 diabetes mellitus, uncontrolled (HCC) 09/06/2019    Past Surgical History:  Procedure Laterality Date  . ADENOIDECTOMY    . DENTAL SURGERY    . TONSILLECTOMY    . tubes in ears         Family History  Problem Relation Age of Onset  . Anxiety disorder Mother   . Hypertension Mother   . Diabetes Mother   . Depression Mother   . High Cholesterol Father   . Anxiety disorder Father     Social History   Tobacco Use  . Smoking status: Former Smoker    Types: Cigars  . Smokeless tobacco: Current User    Types: Snuff  Vaping Use  . Vaping Use: Never  used  Substance Use Topics  . Alcohol use: Not Currently  . Drug use: Not Currently    Home Medications Prior to Admission medications   Medication Sig Start Date End Date Taking? Authorizing Provider  buPROPion (WELLBUTRIN XL) 150 MG 24 hr tablet Take 150 mg by mouth daily after breakfast. 08/17/19   [provider]  doxycycline (VIBRAMYCIN) 100 MG capsule Take 1 capsule (100 mg total) by mouth 2 (two) times daily. One po bid x 7 days 03/01/20   09/08/2019, PA-C  glimepiride (AMARYL) 2 MG tablet Take 1 tablet (2 mg total) by mouth daily with breakfast. 05/04/20   03/03/20, MD  insulin aspart (NOVOLOG) 100 UNIT/ML injection Inject 8 units after meals and Blood sugars greater than 150. 08/27/19   05/06/20, NP  insulin glargine (LANTUS) 100 UNIT/ML injection Inject 0.2 mLs (20 Units total) into the skin at bedtime. Patient not taking: Reported on 09/06/2019 08/27/19   Grayce Sessions, NP  lamoTRIgine (LAMICTAL) 200 MG tablet Take 200 mg by mouth at bedtime. 08/17/19   [provider]  metFORMIN (GLUCOPHAGE) 500 MG tablet Take 2 tablets (1,000 mg total) by mouth 2 (two) times daily with a meal. 08/27/19   Grayce Sessions, NP    Allergies    Cephalosporins and Lithium  Review of Systems   Review of  Systems  Constitutional: Negative for fever.  HENT: Negative for sore throat.   Respiratory: Negative for cough, shortness of breath and wheezing.   Cardiovascular: Negative for chest pain and palpitations.  Gastrointestinal: Negative for abdominal pain, blood in stool, constipation, diarrhea, nausea and vomiting.  Genitourinary: Positive for dysuria, frequency and urgency.  Neurological: Negative for dizziness, light-headedness and headaches.  Hematological: Negative.   Psychiatric/Behavioral: Negative.     Physical Exam Updated Vital Signs BP (!) 146/98 (BP Location: Left Arm)   Pulse (!) 112   Temp 99 F (37.2 C) (Oral)   Resp 16   Ht 5\' 10"   (1.778 m)   Wt 115.7 kg   SpO2 98%   BMI 36.59 kg/m   Physical Exam Constitutional:      General: He is not in acute distress.    Appearance: Normal appearance. He is obese. He is not ill-appearing, toxic-appearing or diaphoretic.  HENT:     Head: Normocephalic and atraumatic.     Mouth/Throat:     Mouth: Mucous membranes are dry.  Eyes:     Extraocular Movements: Extraocular movements intact.     Conjunctiva/sclera: Conjunctivae normal.  Cardiovascular:     Rate and Rhythm: Regular rhythm. Tachycardia present.     Pulses: Normal pulses.     Heart sounds: Normal heart sounds, S1 normal and S2 normal.  Pulmonary:     Effort: Pulmonary effort is normal.     Breath sounds: Normal breath sounds and air entry.  Abdominal:     General: Abdomen is flat. Bowel sounds are normal.     Palpations: Abdomen is soft.  Musculoskeletal:     Cervical back: Normal range of motion and neck supple.  Skin:    General: Skin is warm and dry.  Neurological:     General: No focal deficit present.     Mental Status: He is alert.     ED Results / Procedures / Treatments   Labs (all labs ordered are listed, but only abnormal results are displayed) Labs Reviewed  CBG MONITORING, ED - Abnormal; Notable for the following components:      Result Value   Glucose-Capillary >600 (*)    All other components within normal limits  BASIC METABOLIC PANEL  CBC  URINALYSIS, ROUTINE W REFLEX MICROSCOPIC  CBG MONITORING, ED    EKG None  Radiology No results found.  Procedures Procedures (including critical care time)  Medications Ordered in ED Medications - No data to display  ED Course  I have reviewed the triage vital signs and the nursing notes.  Pertinent labs & imaging results that were available during my care of the patient were reviewed by me and considered in my medical decision making (see chart for details).  Clinical Course as of May 18 1049  Thu May 18, 2020  1049 Glucose,  UA(!): >=500 [PP]    Clinical Course User Index [PP] May 20, 2020, MD   MDM Rules/Calculators/A&P                          Eric Kane is a 49 year old male with past medical history of insulin-dependent diabetes, HLD, depression, substance abuse acute encephalopathy, shoulder pain, anxiety who presents with CBG reading> 600. Exam unremarkable. Vital signs: HR 103. T 99, BP 133/86, RR 18. UA glucose >500.  BMP: Na 129, K 4.4, HCO2 23, Cr 1, Ca 9.5, Gap 12. CBC:  Hb 12.7, WBC 7.9. Pt not in DKA.  Pt  received 1L bolus N.saline, 2L LR. CBGs [phone].  14:00: Reviewed patient who is feeling better following 3L bolus of fluid. Contacted patient's PCP Dr Derrell Lolling and we discussed best course of action over the weekend until his appointment with him on Monday. Pt should continue Glimipride, Metformin and sliding scale Novolog.   Pt given 15 units Novolog and CBG improved to 299. Discharged with strict return precautions and close follow up with PCP.     Final Clinical Impression(s) / ED Diagnoses Final diagnoses:  None    Rx / DC Orders ED Discharge Orders    None       Towanda Octave, MD 05/18/20 1531    Tilden Fossa, MD 05/19/20 [phone]

## 2020-06-03 ENCOUNTER — Emergency Department (HOSPITAL_COMMUNITY)
Admission: EM | Admit: 2020-06-03 | Discharge: 2020-06-04 | Disposition: A | Discharge status: Home / Self Care | Payer: 59 | Attending: Emergency Medicine | Admitting: Emergency Medicine

## 2020-06-03 ENCOUNTER — Other Ambulatory Visit: Payer: Self-pay

## 2020-06-03 ENCOUNTER — Encounter (HOSPITAL_COMMUNITY): Payer: Self-pay | Admitting: Emergency Medicine

## 2020-06-03 DIAGNOSIS — Z87891 Personal history of nicotine dependence: Secondary | ICD-10-CM | POA: Insufficient documentation

## 2020-06-03 DIAGNOSIS — Z7984 Long term (current) use of oral hypoglycemic drugs: Secondary | ICD-10-CM | POA: Insufficient documentation

## 2020-06-03 DIAGNOSIS — F17290 Nicotine dependence, other tobacco product, uncomplicated: Secondary | ICD-10-CM | POA: Diagnosis not present

## 2020-06-03 DIAGNOSIS — F1910 Other psychoactive substance abuse, uncomplicated: Secondary | ICD-10-CM | POA: Diagnosis not present

## 2020-06-03 DIAGNOSIS — Z794 Long term (current) use of insulin: Secondary | ICD-10-CM | POA: Diagnosis not present

## 2020-06-03 DIAGNOSIS — R000 Tachycardia, unspecified: Secondary | ICD-10-CM | POA: Insufficient documentation

## 2020-06-03 DIAGNOSIS — E119 Type 2 diabetes mellitus without complications: Secondary | ICD-10-CM | POA: Diagnosis not present

## 2020-06-03 DIAGNOSIS — T50901A Poisoning by unspecified drugs, medicaments and biological substances, accidental (unintentional), initial encounter: Secondary | ICD-10-CM | POA: Diagnosis not present

## 2020-06-03 DIAGNOSIS — R739 Hyperglycemia, unspecified: Secondary | ICD-10-CM

## 2020-06-03 LAB — CBC WITH DIFFERENTIAL/PLATELET
Abs Immature Granulocytes: 0.03 10*3/uL (ref 0.00–0.07)
Basophils Absolute: 0 10*3/uL (ref 0.0–0.1)
Basophils Relative: 0 %
Eosinophils Absolute: 0.1 10*3/uL (ref 0.0–0.5)
Eosinophils Relative: 1 %
HCT: 41.2 % (ref 39.0–52.0)
Hemoglobin: 13.7 g/dL (ref 13.0–17.0)
Immature Granulocytes: 0 %
Lymphocytes Relative: 24 %
Lymphs Abs: 2.1 10*3/uL (ref 0.7–4.0)
MCH: 31.9 pg (ref 26.0–34.0)
MCHC: 33.3 g/dL (ref 30.0–36.0)
MCV: 95.8 fL (ref 80.0–100.0)
Monocytes Absolute: 0.6 10*3/uL (ref 0.1–1.0)
Monocytes Relative: 7 %
Neutro Abs: 6 10*3/uL (ref 1.7–7.7)
Neutrophils Relative %: 68 %
Platelets: 255 10*3/uL (ref 150–400)
RBC: 4.3 MIL/uL (ref 4.22–5.81)
RDW: 13.4 % (ref 11.5–15.5)
WBC: 8.9 10*3/uL (ref 4.0–10.5)
nRBC: 0 % (ref 0.0–0.2)

## 2020-06-03 LAB — COMPREHENSIVE METABOLIC PANEL
ALT: 32 U/L (ref 0–44)
AST: 32 U/L (ref 15–41)
Albumin: 4.6 g/dL (ref 3.5–5.0)
Alkaline Phosphatase: 74 U/L (ref 38–126)
Anion gap: 14 (ref 5–15)
BUN: 14 mg/dL (ref 6–20)
CO2: 22 mmol/L (ref 22–32)
Calcium: 9.9 mg/dL (ref 8.9–10.3)
Chloride: 99 mmol/L (ref 98–111)
Creatinine, Ser: 1.13 mg/dL (ref 0.61–1.24)
GFR, Estimated: >60 mL/min (ref >60)
Glucose, Bld: 312 mg/dL — ABNORMAL HIGH (ref 70–99)
Potassium: 4.5 mmol/L (ref 3.5–5.1)
Sodium: 135 mmol/L (ref 135–145)
Total Bilirubin: 2.2 mg/dL — ABNORMAL HIGH (ref 0.3–1.2)
Total Protein: 8 g/dL (ref 6.5–8.1)

## 2020-06-03 LAB — CBG MONITORING, ED: Glucose-Capillary: 318 mg/dL — ABNORMAL HIGH (ref 70–99)

## 2020-06-03 LAB — ETHANOL: Alcohol, Ethyl (B): <10 mg/dL (ref <10)

## 2020-06-03 LAB — SALICYLATE LEVEL: Salicylate Lvl: <7 mg/dL — ABNORMAL LOW (ref 7.0–30.0)

## 2020-06-03 LAB — ACETAMINOPHEN LEVEL: Acetaminophen (Tylenol), Serum: <10 ug/mL — ABNORMAL LOW (ref 10–30)

## 2020-06-03 MED ORDER — SODIUM CHLORIDE 0.9 % IV SOLN: INTRAVENOUS | Status: DC

## 2020-06-03 MED ORDER — SODIUM CHLORIDE 0.9 % IV BOLUS
1000.0000 mL | Freq: Once | INTRAVENOUS | Status: AC
  Administered 2020-06-03: 1000 mL via INTRAVENOUS

## 2020-06-03 NOTE — ED Provider Notes (Signed)
[facility]-EMERGENCY DEPT Provider Note   CSN: [id number] Arrival date & time: 06/03/20  2142     History Chief Complaint  Patient presents with   Drug Overdose    Eric Kane is a 49 y.o. male.  HPI    49 year old male history of anxiety, type 2 diabetes presents today stating that he took a purposeful overdose of Mucinex to "get high".  He denies suicidal ideation or homicidal ideation.  He denies any intention to harm self.  It is unclear how patient notified friend to bring him to ED.  Patient denies any headache, head injury, neck pain, chest pain, dyspnea, nausea vomiting or diarrhea. Past Medical History:  Diagnosis Date   Anxiety    Chronic shoulder pain    Diabetes mellitus (HCC)     Patient Active Problem List   Diagnosis Date Noted   Acute encephalopathy 09/06/2019   Insulin dependent type 2 diabetes mellitus, uncontrolled (HCC) 09/06/2019    Past Surgical History:  Procedure Laterality Date   ADENOIDECTOMY     DENTAL SURGERY     TONSILLECTOMY     tubes in ears         Family History  Problem Relation Age of Onset   Anxiety disorder Mother    Hypertension Mother    Diabetes Mother    Depression Mother    High Cholesterol Father    Anxiety disorder Father     Social History   Tobacco Use   Smoking status: Former Smoker    Types: Cigars   Smokeless tobacco: Current User    Types: Snuff  Vaping Use   Vaping Use: Never used  Substance Use Topics   Alcohol use: Not Currently   Drug use: Not Currently    Home Medications Prior to Admission medications   Medication Sig Start Date End Date Taking? Authorizing Provider  buPROPion (WELLBUTRIN XL) 150 MG 24 hr tablet Take 150 mg by mouth in the morning and at bedtime.  08/17/19   [provider]  Cholecalciferol (VITAMIN D3) 50 MCG (2000 UT) capsule Take 4,000 Units by mouth daily.    [provider]  doxycycline (VIBRAMYCIN) 100 MG  capsule Take 1 capsule (100 mg total) by mouth 2 (two) times daily. One po bid x 7 days Patient not taking: Reported on 05/18/2020 03/01/20   Fayrene Helper, PA-C  glimepiride (AMARYL) 2 MG tablet Take 1 tablet (2 mg total) by mouth daily with breakfast. 05/04/20   Terrilee Files, MD  insulin aspart (NOVOLOG) 100 UNIT/ML injection Inject 8 units after meals and Blood sugars greater than 150. Patient taking differently: Inject 10-50 Units into the skin See admin instructions. Sliding scale depending on blood sugar 08/27/19   Grayce Sessions, NP  insulin glargine (LANTUS) 100 UNIT/ML injection Inject 0.2 mLs (20 Units total) into the skin at bedtime. Patient not taking: Reported on 09/06/2019 08/27/19   Grayce Sessions, NP  lamoTRIgine (LAMICTAL) 150 MG tablet Take 300 mg by mouth at bedtime.  08/17/19   [provider]  losartan (COZAAR) 25 MG tablet Take 25 mg by mouth daily. 05/03/20   [provider]  meloxicam (MOBIC) 15 MG tablet Take 15 mg by mouth daily as needed for pain.  04/26/20   [provider]  metFORMIN (GLUCOPHAGE) 1000 MG tablet Take 1,000 mg by mouth 2 (two) times daily. 05/08/20   [provider]  metFORMIN (GLUCOPHAGE) 500 MG tablet Take 2 tablets (1,000 mg total) by mouth  2 (two) times daily with a meal. Patient not taking: Reported on 05/18/2020 08/27/19   Grayce Sessions, NP  Multiple Vitamin (MULTIVITAMIN WITH MINERALS) TABS tablet Take 1 tablet by mouth daily.    [provider]  RABEprazole (ACIPHEX) 20 MG tablet Take 20 mg by mouth in the morning and at bedtime. 12/17/19   [provider]  rosuvastatin (CRESTOR) 20 MG tablet Take 20 mg by mouth at bedtime. 04/14/20   [provider]  vitamin B-12 (CYANOCOBALAMIN) 1000 MCG tablet Take 1,000 mcg by mouth daily.    [provider]    Allergies    Cephalosporins and Lithium  Review of Systems   Review of Systems  All other systems reviewed and are  negative.   Physical Exam Updated Vital Signs BP 114/85 (BP Location: Right Arm)    Pulse (!) 123    Temp 98 F (36.7 C) (Oral)    Resp 16    Ht 1.778 m (5\' 10" )    Wt 115.7 kg    SpO2 94%    BMI 36.59 kg/m   Physical Exam Vitals and nursing note reviewed.  Constitutional:      Appearance: Normal appearance.  HENT:     Head: Normocephalic.     Right Ear: External ear normal.     Left Ear: External ear normal.     Nose: Nose normal.     Mouth/Throat:     Mouth: Mucous membranes are dry.  Eyes:     Extraocular Movements: Extraocular movements intact.     Pupils: Pupils are equal, round, and reactive to light.  Cardiovascular:     Rate and Rhythm: Regular rhythm. Tachycardia present.     Pulses: Normal pulses.  Pulmonary:     Effort: Pulmonary effort is normal.  Abdominal:     General: Abdomen is flat.     Palpations: Abdomen is soft.  Musculoskeletal:        General: Normal range of motion.     Cervical back: Normal range of motion.  Skin:    General: Skin is warm.     Capillary Refill: Capillary refill takes less than 2 seconds.  Neurological:     General: No focal deficit present.     Mental Status: He is alert and oriented to person, place, and time.     Cranial Nerves: No cranial nerve deficit.     Motor: No weakness.     Gait: Gait normal.  Psychiatric:        Mood and Affect: Mood normal.     ED Results / Procedures / Treatments   Labs (all labs ordered are listed, but only abnormal results are displayed) Labs Reviewed  COMPREHENSIVE METABOLIC PANEL - Abnormal; Notable for the following components:      Result Value   Glucose, Bld 312 (*)    Total Bilirubin 2.2 (*)    All other components within normal limits  CBG MONITORING, ED - Abnormal; Notable for the following components:   Glucose-Capillary 318 (*)    All other components within normal limits  CBC WITH DIFFERENTIAL/PLATELET  SALICYLATE LEVEL  ACETAMINOPHEN LEVEL  ETHANOL  RAPID URINE DRUG  SCREEN, HOSP PERFORMED    EKG EKG Interpretation  Date/Time:  Saturday June 03 2020 22:08:21 EDT Ventricular Rate:  115 PR Interval:    QRS Duration: 86 QT Interval:  324 QTC Calculation: 449 R Axis:   62 Text Interpretation: Sinus tachycardia Baseline wander in lead(s) V3 No significant change since  last tracing Confirmed by Margarita Grizzle [phone]) on 06/03/2020 10:15:06 PM   Radiology No results found.  Procedures Procedures (including critical care time)  Medications Ordered in ED Medications  sodium chloride 0.9 % bolus 1,000 mL (1,000 mLs Intravenous New Bag/Given 06/03/20 2214)    And  0.9 %  sodium chloride infusion (has no administration in time range)    ED Course  I have reviewed the triage vital signs and the nursing notes.  Pertinent labs & imaging results that were available during my care of the patient were reviewed by me and considered in my medical decision making (see chart for details). Patient with report of mucinex overdose. Pill checked and appears to be guafenisin only.Guafenesin od suggested by tachycardia and diaphoresis. Pupils not dilated UDS, tylenol and asa levels pending Patient tachycardiac but review of records show consistent tachycardia Plan monitor , consult poison control  Poison control advises 8 hours obs to baseline and supportive care Discussed with Dr. Read Drivers and he will follow Likely d/c   Clinical Course as of Jun 03 2316  Sat Jun 03, 2020  2316 Labs reviewed Hyperglycemia noted    [DR]    Clinical Course User Index [DR] Margarita Grizzle, MD   MDM Rules/Calculators/A&P                          Final Clinical Impression(s) / ED Diagnoses Final diagnoses:  Accidental drug overdose, initial encounter    Rx / DC Orders ED Discharge Orders    None       Margarita Grizzle, MD 06/03/20 2322

## 2020-06-03 NOTE — ED Notes (Signed)
Pt also had chewing tobacco in the side of his cheek. Pt spit out tobacco in basin and it was discarded.   Pt belongings placed in cabinet behind recess side

## 2020-06-03 NOTE — ED Triage Notes (Signed)
Pt reports taking 30 mucinex pills to get high. Denies SI/HI. Reports lethargy, confusion, and tachycardia.

## 2020-06-03 NOTE — ED Notes (Signed)
Poison Control contacted recommends the following: Q4 hour Tylenol & BMP Monitor for HTN, Tachycardia, Nystagmus, Diaphoresis, and Emesis 8 hour obs to baseline Supportive Care

## 2020-06-03 NOTE — ED Notes (Signed)
Patient noted to become increasingly more somnolent

## 2020-06-04 ENCOUNTER — Emergency Department (HOSPITAL_COMMUNITY)
Admission: EM | Admit: 2020-06-04 | Discharge: 2020-06-04 | Discharge status: Against Medical Advice | Payer: 59 | Source: Home / Self Care | Attending: Emergency Medicine | Admitting: Emergency Medicine

## 2020-06-04 ENCOUNTER — Encounter (HOSPITAL_COMMUNITY): Payer: Self-pay

## 2020-06-04 DIAGNOSIS — F1910 Other psychoactive substance abuse, uncomplicated: Secondary | ICD-10-CM | POA: Insufficient documentation

## 2020-06-04 DIAGNOSIS — E119 Type 2 diabetes mellitus without complications: Secondary | ICD-10-CM | POA: Insufficient documentation

## 2020-06-04 DIAGNOSIS — F17290 Nicotine dependence, other tobacco product, uncomplicated: Secondary | ICD-10-CM | POA: Insufficient documentation

## 2020-06-04 LAB — CBC WITH DIFFERENTIAL/PLATELET
Abs Immature Granulocytes: 0.02 10*3/uL (ref 0.00–0.07)
Basophils Absolute: 0 10*3/uL (ref 0.0–0.1)
Basophils Relative: 0 %
Eosinophils Absolute: 0 10*3/uL (ref 0.0–0.5)
Eosinophils Relative: 0 %
HCT: 39.4 % (ref 39.0–52.0)
Hemoglobin: 12.8 g/dL — ABNORMAL LOW (ref 13.0–17.0)
Immature Granulocytes: 0 %
Lymphocytes Relative: 24 %
Lymphs Abs: 2.1 10*3/uL (ref 0.7–4.0)
MCH: 31.7 pg (ref 26.0–34.0)
MCHC: 32.5 g/dL (ref 30.0–36.0)
MCV: 97.5 fL (ref 80.0–100.0)
Monocytes Absolute: 0.6 10*3/uL (ref 0.1–1.0)
Monocytes Relative: 7 %
Neutro Abs: 5.9 10*3/uL (ref 1.7–7.7)
Neutrophils Relative %: 69 %
Platelets: 271 10*3/uL (ref 150–400)
RBC: 4.04 MIL/uL — ABNORMAL LOW (ref 4.22–5.81)
RDW: 13.5 % (ref 11.5–15.5)
WBC: 8.6 10*3/uL (ref 4.0–10.5)
nRBC: 0 % (ref 0.0–0.2)

## 2020-06-04 LAB — COMPREHENSIVE METABOLIC PANEL
ALT: 30 U/L (ref 0–44)
AST: 24 U/L (ref 15–41)
Albumin: 4.4 g/dL (ref 3.5–5.0)
Alkaline Phosphatase: 70 U/L (ref 38–126)
Anion gap: 10 (ref 5–15)
BUN: 9 mg/dL (ref 6–20)
CO2: 22 mmol/L (ref 22–32)
Calcium: 9.2 mg/dL (ref 8.9–10.3)
Chloride: 101 mmol/L (ref 98–111)
Creatinine, Ser: 0.94 mg/dL (ref 0.61–1.24)
GFR, Estimated: >60 mL/min (ref >60)
Glucose, Bld: 236 mg/dL — ABNORMAL HIGH (ref 70–99)
Potassium: 3.6 mmol/L (ref 3.5–5.1)
Sodium: 133 mmol/L — ABNORMAL LOW (ref 135–145)
Total Bilirubin: 0.6 mg/dL (ref 0.3–1.2)
Total Protein: 7.5 g/dL (ref 6.5–8.1)

## 2020-06-04 LAB — RAPID URINE DRUG SCREEN, HOSP PERFORMED
Amphetamines: NOT DETECTED
Barbiturates: NOT DETECTED
Benzodiazepines: NOT DETECTED
Cocaine: NOT DETECTED
Opiates: NOT DETECTED
Tetrahydrocannabinol: NOT DETECTED

## 2020-06-04 LAB — ETHANOL: Alcohol, Ethyl (B): <10 mg/dL (ref <10)

## 2020-06-04 MED ORDER — INSULIN ASPART 100 UNIT/ML ~~LOC~~ SOLN
10.0000 [IU] | Freq: Once | SUBCUTANEOUS | Status: AC
  Administered 2020-06-04: 10 [IU] via SUBCUTANEOUS
  Filled 2020-06-04: qty 0.1

## 2020-06-04 NOTE — ED Provider Notes (Signed)
[facility]-EMERGENCY DEPT Provider Note   CSN: [id number] Arrival date & time: 06/04/20  1012     History Chief Complaint  Patient presents with  . Drug Problem    Eric Kane is a 49 y.o. male.  HPI    Patient presents to the emergency room for assistance in getting into a treatment program. Patient was in the emergency room last evening. Patient took an overdose of Mucinex in order to get high. Patient denied any suicidal or homicidal ideation. Patient was evaluated and monitored. He was eventually cleared. Patient was discharged early this morning. Patient states his sponsor is concerned about his drug abuse. Patient was kicked out of the program. He wants to try to get him into [facility] however the patient has some housing issues in the interim so he was brought to the ED. Patient denies any trouble with suicidal or homicidal ideation.  Past Medical History:  Diagnosis Date  . Anxiety   . Chronic shoulder pain   . Diabetes mellitus [facility])     Patient Active Problem List   Diagnosis Date Noted  . Acute encephalopathy 09/06/2019  . Insulin dependent type 2 diabetes mellitus, uncontrolled (HCC) 09/06/2019    Past Surgical History:  Procedure Laterality Date  . ADENOIDECTOMY    . DENTAL SURGERY    . TONSILLECTOMY    . tubes in ears         Family History  Problem Relation Age of Onset  . Anxiety disorder Mother   . Hypertension Mother   . Diabetes Mother   . Depression Mother   . High Cholesterol Father   . Anxiety disorder Father     Social History   Tobacco Use  . Smoking status: Former Smoker    Types: Cigars  . Smokeless tobacco: Current User    Types: Snuff  Vaping Use  . Vaping Use: Never used  Substance Use Topics  . Alcohol use: Not Currently  . Drug use: Not Currently    Home Medications Prior to Admission medications   Medication Sig Start Date End Date Taking? Authorizing Provider  buPROPion (WELLBUTRIN  XL) 150 MG 24 hr tablet Take 150 mg by mouth in the morning and at bedtime.  08/17/19   [provider]  Cholecalciferol (VITAMIN D3) 50 MCG (2000 UT) capsule Take 4,000 Units by mouth daily.    [provider]  glimepiride (AMARYL) 2 MG tablet Take 1 tablet (2 mg total) by mouth daily with breakfast. 05/04/20   Terrilee Files, MD  insulin aspart (NOVOLOG) 100 UNIT/ML injection Inject 8 units after meals and Blood sugars greater than 150. Patient taking differently: Inject 10-50 Units into the skin See admin instructions. Sliding scale depending on blood sugar 08/27/19   Grayce Sessions, NP  lamoTRIgine (LAMICTAL) 150 MG tablet Take 300 mg by mouth at bedtime.  08/17/19   [provider]  losartan (COZAAR) 25 MG tablet Take 25 mg by mouth daily. 05/03/20   [provider]  meloxicam (MOBIC) 15 MG tablet Take 15 mg by mouth daily as needed for pain.  04/26/20   [provider]  metFORMIN (GLUCOPHAGE) 1000 MG tablet Take 1,000 mg by mouth 2 (two) times daily. 05/08/20   [provider]  Multiple Vitamin (MULTIVITAMIN WITH MINERALS) TABS tablet Take 1 tablet by mouth daily.    [provider]  RABEprazole (ACIPHEX) 20 MG tablet Take 20 mg by mouth in the morning and at bedtime. 12/17/19  [provider]  rosuvastatin (CRESTOR) 20 MG tablet Take 20 mg by mouth at bedtime. 04/14/20   [provider]  Semaglutide,0.25 or 0.5MG /DOS, (OZEMPIC, 0.25 OR 0.5 MG/DOSE,) 2 MG/1.5ML SOPN Inject 0.19 mLs into the skin every 7 (seven) days. 05/22/20   [provider]  vitamin B-12 (CYANOCOBALAMIN) 1000 MCG tablet Take 1,000 mcg by mouth daily.    [provider]  insulin glargine (LANTUS) 100 UNIT/ML injection Inject 0.2 mLs (20 Units total) into the skin at bedtime. Patient not taking: Reported on 09/06/2019 08/27/19 06/04/20  Grayce Sessions, NP    Allergies    Cephalosporins and Lithium  Review of Systems     Review of Systems  All other systems reviewed and are negative.   Physical Exam Updated Vital Signs BP (!) 171/98 (BP Location: Right Arm)   Pulse 74   Temp 98.1 F (36.7 C) (Oral)   Resp 18   SpO2 100%   Physical Exam Vitals and nursing note reviewed.  Constitutional:      General: He is not in acute distress.    Appearance: He is well-developed.  HENT:     Head: Normocephalic and atraumatic.     Right Ear: External ear normal.     Left Ear: External ear normal.  Eyes:     General: No scleral icterus.       Right eye: No discharge.        Left eye: No discharge.     Conjunctiva/sclera: Conjunctivae normal.  Neck:     Trachea: No tracheal deviation.  Cardiovascular:     Rate and Rhythm: Normal rate and regular rhythm.  Pulmonary:     Effort: Pulmonary effort is normal. No respiratory distress.     Breath sounds: Normal breath sounds. No stridor. No wheezing or rales.  Abdominal:     General: Bowel sounds are normal. There is no distension.     Palpations: Abdomen is soft.     Tenderness: There is no abdominal tenderness. There is no guarding or rebound.  Musculoskeletal:        General: No tenderness.     Cervical back: Neck supple.  Skin:    General: Skin is warm and dry.     Findings: No rash.  Neurological:     Mental Status: He is alert.     Cranial Nerves: No cranial nerve deficit (no facial droop, extraocular movements intact, no slurred speech).     Sensory: No sensory deficit.     Motor: No abnormal muscle tone or seizure activity.     Coordination: Coordination normal.     ED Results / Procedures / Treatments   Labs (all labs ordered are listed, but only abnormal results are displayed) Labs Reviewed  COMPREHENSIVE METABOLIC PANEL - Abnormal; Notable for the following components:      Result Value   Sodium 133 (*)    Glucose, Bld 236 (*)    All other components within normal limits  CBC WITH DIFFERENTIAL/PLATELET - Abnormal; Notable for the  following components:   RBC 4.04 (*)    Hemoglobin 12.8 (*)    All other components within normal limits  ETHANOL  RAPID URINE DRUG SCREEN, HOSP PERFORMED     Procedures Procedures (including critical care time)  Medications Ordered in ED Medications - No data to display  ED Course  I have reviewed the triage vital signs and the nursing notes.  Pertinent labs & imaging results that were available during my care  of the patient were reviewed by me and considered in my medical decision making (see chart for details).  Clinical Course as of Jun 05 1455  Wynelle Link Jun 04, 2020  1415 Labs reviewed.  No acute findings.   [JK]    Clinical Course User Index [JK] Linwood Dibbles, MD   MDM Rules/Calculators/A&P                         Patient presented to the ED for assistance with trying to get into a treatment center.  Patient's ED work-up was reassuring.  Patient was noted to be hypertensive but doubt acute issues .  Peer support consult was placed.  Plan was to provide patient with outpatient resources.  Patient ended up leaving before he was formally discharged although the plan was to dc him from the ED  final Clinical Impression(s) / ED Diagnoses Final diagnoses:  Substance abuse [facility])    Rx / DC Orders ED Discharge Orders    None       Linwood Dibbles, MD 06/04/20 1457

## 2020-06-04 NOTE — ED Notes (Signed)
Pt encouraged to give urine sample, given water.  Pt says "someone is coming to get me to take me somewhere so I don't understand why I have to give you that".

## 2020-06-04 NOTE — ED Notes (Signed)
Patient was found in neighboring room after multiple discussions with the patient that the behavior was inappropriate. Patient discharged and escorted out by security.

## 2020-06-04 NOTE — ED Provider Notes (Signed)
Patient awake, alert and appears to be at baseline.  I see no reason for further observation.       Eric Kane, Eric Ruiz, MD 06/04/20 [phone]

## 2020-06-04 NOTE — ED Triage Notes (Signed)
Pt arrived via walk in, requesting detox, states he took 30 mucinex pills last night, was seen, medically cleared, now arrived looking for rehab placement. Denies any SI/HI. Aox4 in triage, ambulatory with steady gait, clear speech.

## 2020-06-04 NOTE — ED Notes (Signed)
Staff member came up to this Clinical research associate and informed staff that this patient was standing over the patient in RES A "praying over her". Staff members went into room and instructed patient that he can not be over there with another patient and was instructed to go back to his side and get dressed while awaiting his discharge paperwork.

## 2020-06-04 NOTE — ED Notes (Signed)
Pt caught hovering over patient in RES B again. Security called and patient escorted off property.

## 2020-11-24 IMAGING — CT CT HEAD W/O CM
3 series · 15 of 47 positions shown, 18 images · non-contrast
Comparison: None.

CLINICAL DATA: Altered mental status (AMS), unclear cause

EXAM:
CT HEAD WITHOUT CONTRAST
TECHNIQUE: Contiguous axial images were obtained from the base of the skull
through the vertex without intravenous contrast.

[Series 2: head wo · axial · 0.45mm/px · z∈[-115,+20]mm · 9 of 33 slices shown, 12 images]
[im 3/33  brain]
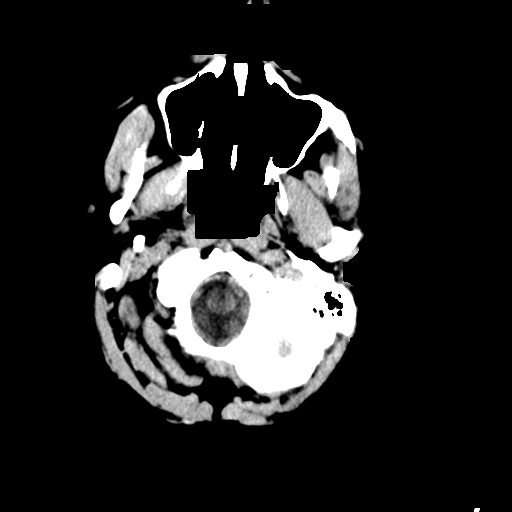
[im 3/33  bone]
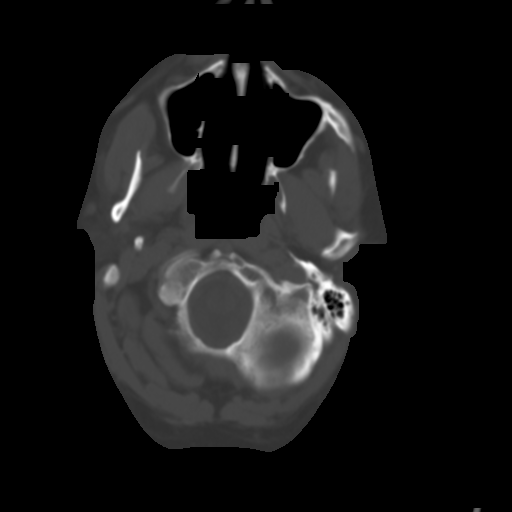
[im 6/33  brain]
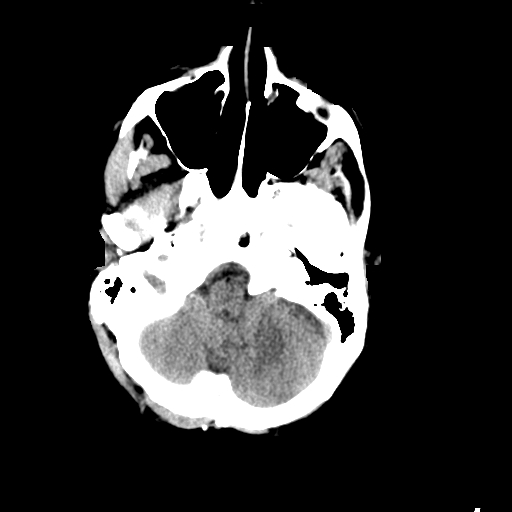
[im 9/33  brain]
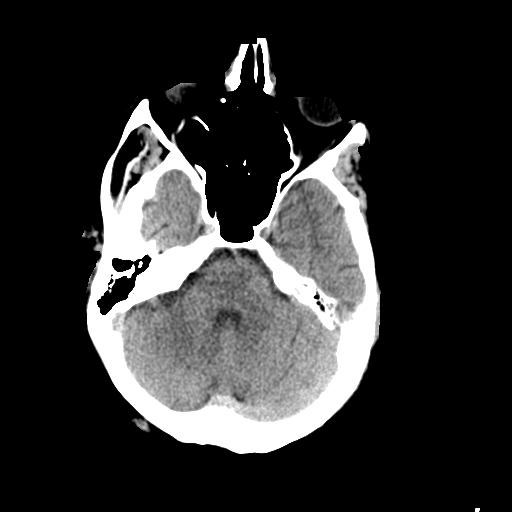
[im 13/33  brain]
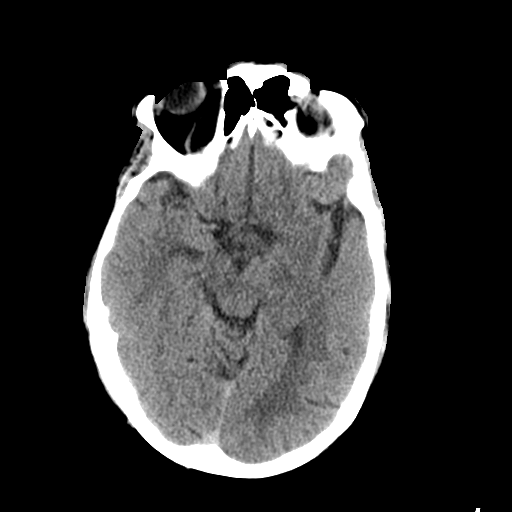
[im 17/33  brain]
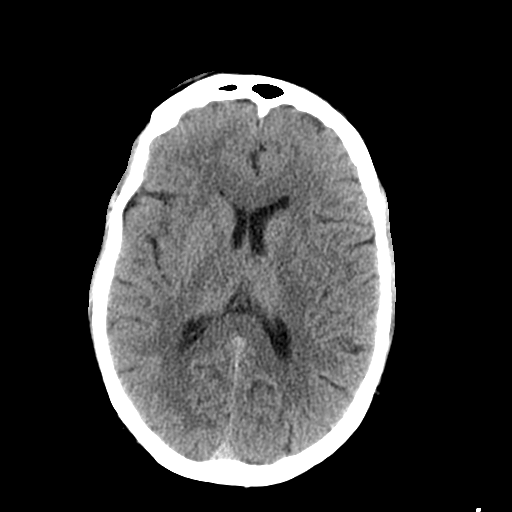
[im 17/33  bone]
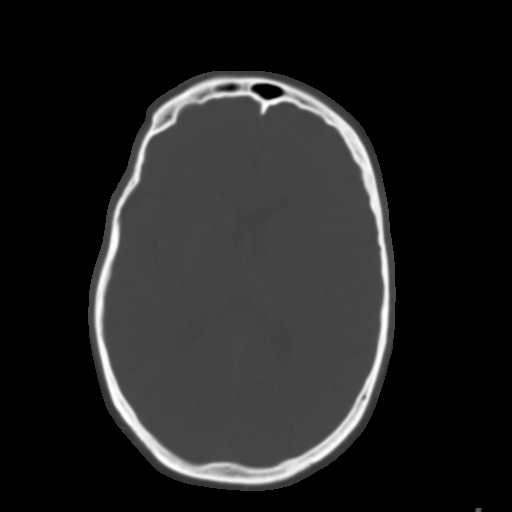
[im 20/33  brain]
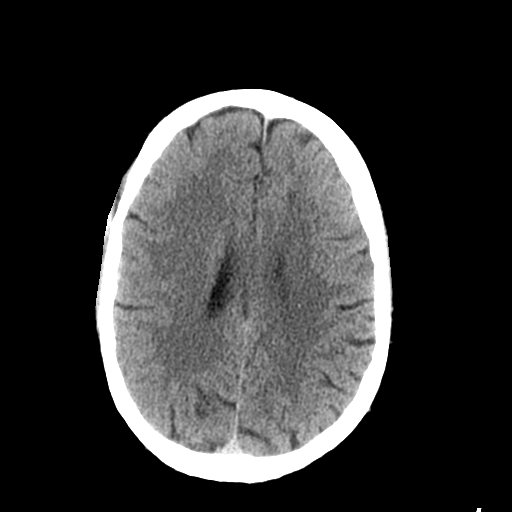
[im 24/33  brain]
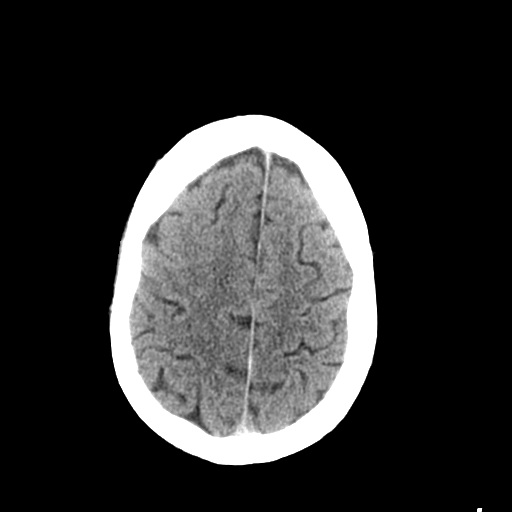
[im 27/33  brain]
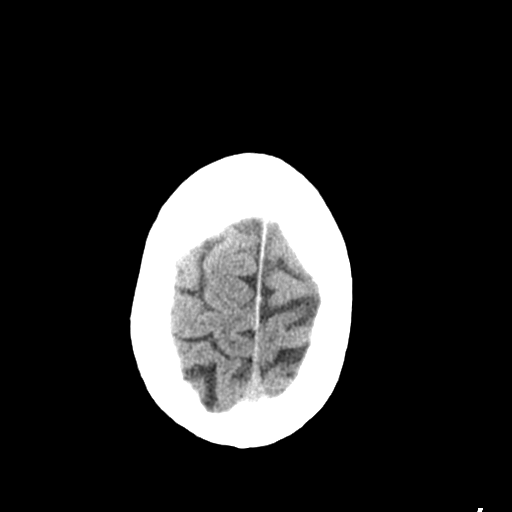
[im 30/33  brain]
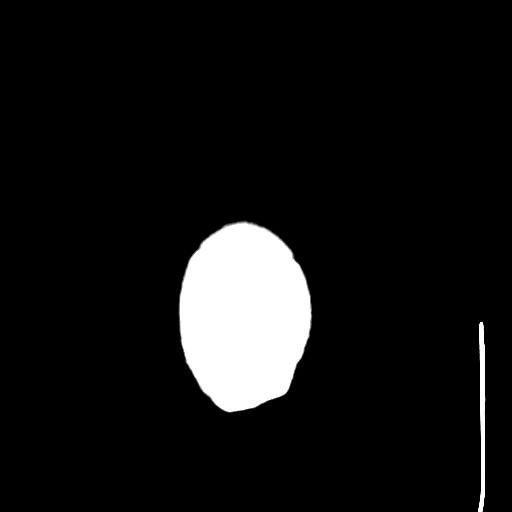
[im 30/33  bone]
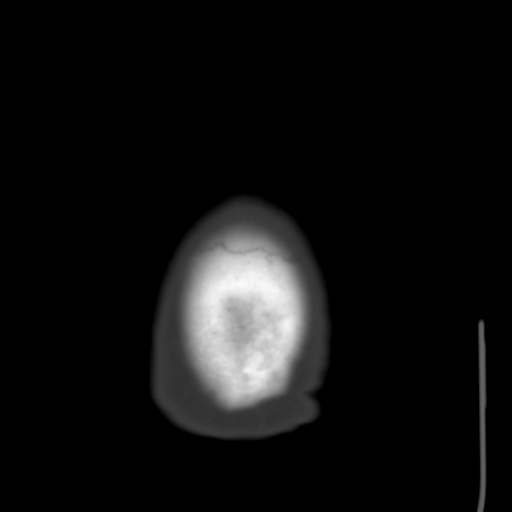

[Series 5: coronal soft tissue · coronal · 0.33mm/px · 3 of 74 slices shown]
[im 25/74  brain]
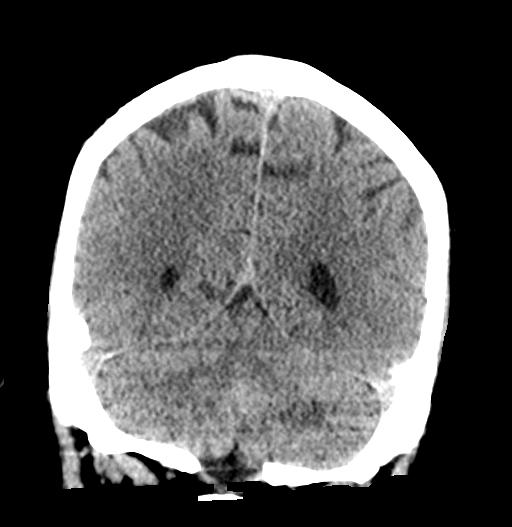
[im 33/74  brain]
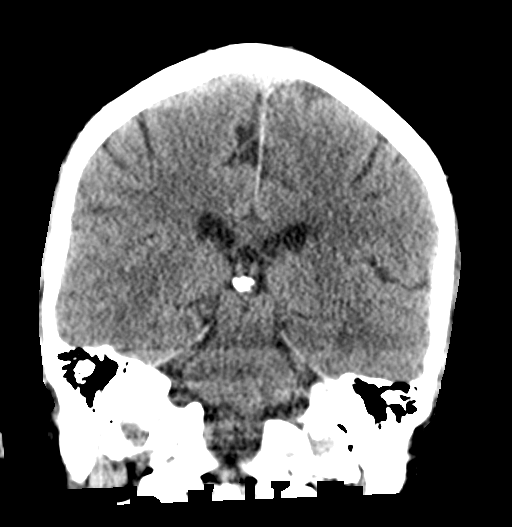
[im 41/74  brain]
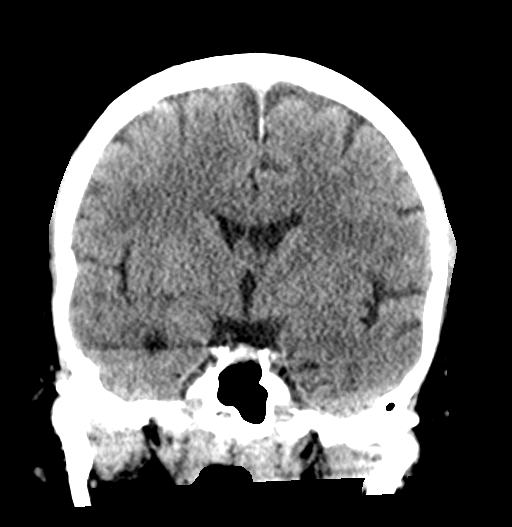

[Series 6: sagittal soft tissue · sagittal · 0.33mm/px · 3 of 57 slices shown]
[im 19/57  brain]
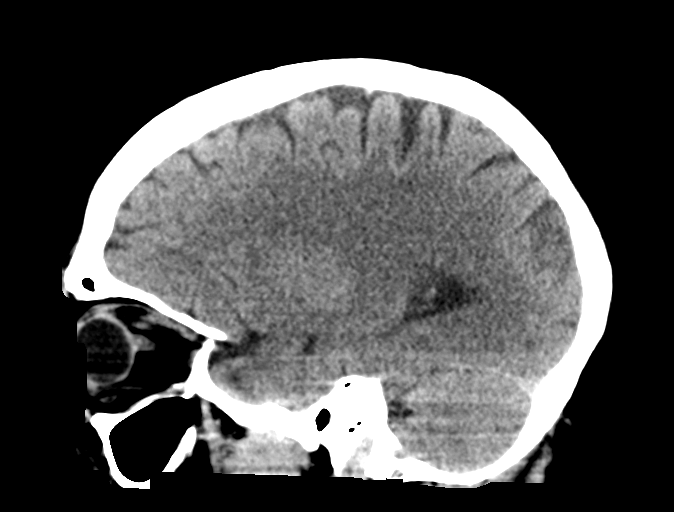
[im 29/57  brain]
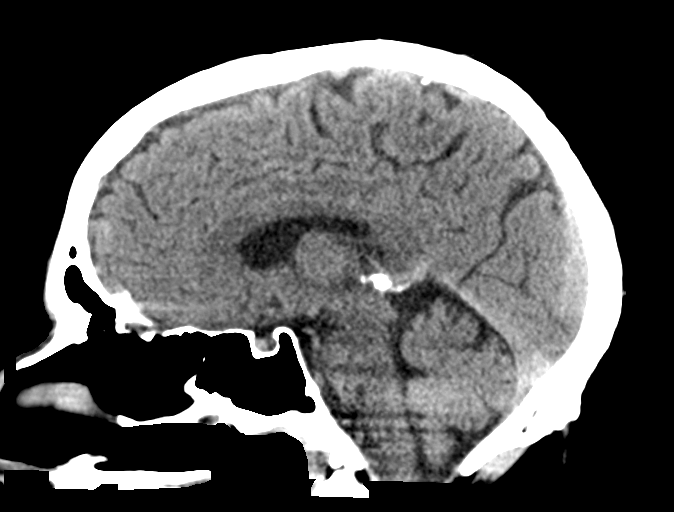
[im 38/57  brain]
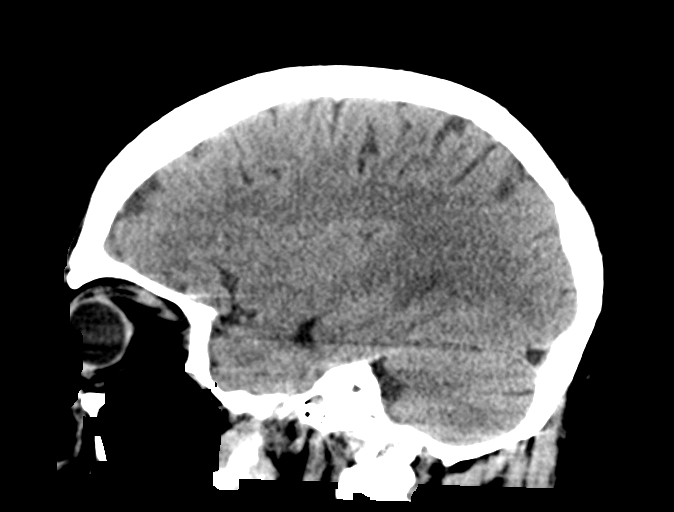

[15 of 47 positions shown; findings below may reference images not displayed]

FINDINGS: Brain: Possible asymmetric low-density in the inferior left
cerebellum. Alternatively this may be related to artifact. No
intracranial hemorrhage. No midline shift or mass effect. No
hydrocephalus. Basilar cisterns are patent.

Vascular: No hyperdense vessel.

Skull: No fracture or focal lesion.

Sinuses/Orbits: Tiny mucous retention cyst in the right maxillary
sinus. Paranasal sinuses and mastoid air cells are otherwise clear.
The visualized orbits are unremarkable.

Other: None.
IMPRESSION: Possible asymmetric low-density in the inferior left cerebellum
which may represent ischemia, versus skull base artifact. Consider
MRI for further evaluation.

Otherwise negative head CT.

## 2021-08-05 ENCOUNTER — Other Ambulatory Visit: Payer: Self-pay

## 2021-08-05 ENCOUNTER — Emergency Department (HOSPITAL_COMMUNITY)
Admission: EM | Admit: 2021-08-05 | Discharge: 2021-08-05 | Disposition: A | Discharge status: Home / Self Care | Payer: 59 | Attending: Emergency Medicine | Admitting: Emergency Medicine

## 2021-08-05 ENCOUNTER — Encounter (HOSPITAL_COMMUNITY): Payer: Self-pay

## 2021-08-05 DIAGNOSIS — Z794 Long term (current) use of insulin: Secondary | ICD-10-CM | POA: Diagnosis not present

## 2021-08-05 DIAGNOSIS — Z87891 Personal history of nicotine dependence: Secondary | ICD-10-CM | POA: Insufficient documentation

## 2021-08-05 DIAGNOSIS — F10129 Alcohol abuse with intoxication, unspecified: Secondary | ICD-10-CM | POA: Diagnosis not present

## 2021-08-05 DIAGNOSIS — Z7984 Long term (current) use of oral hypoglycemic drugs: Secondary | ICD-10-CM | POA: Insufficient documentation

## 2021-08-05 DIAGNOSIS — E119 Type 2 diabetes mellitus without complications: Secondary | ICD-10-CM | POA: Diagnosis not present

## 2021-08-05 DIAGNOSIS — F10920 Alcohol use, unspecified with intoxication, uncomplicated: Secondary | ICD-10-CM

## 2021-08-05 NOTE — ED Provider Notes (Addendum)
[facility]-EMERGENCY DEPT Eric Kane Note   CSN: [id number] Arrival date & time: 08/05/21  2109     History Chief Complaint  Patient presents with   Alcohol Intoxication    Eric Kane is a 50 y.o. male.  HPI Patient is a 50 year old male with a medical history as noted below.  He presents to the emergency department due to alcohol intoxication.  Patient states that he has been residing in [facility] [place] for the past 9 days and earlier today he said he drank a small amount of wine as well as smoked marijuana.  Denies any numbness, weakness, visual changes, headaches.  Denies any chest pain or shortness of breath.  States that he was told by [facility] [place] to go to the emergency department for evaluation.    Past Medical History:  Diagnosis Date   Anxiety    Chronic shoulder pain    Diabetes mellitus (HCC)     Patient Active Problem List   Diagnosis Date Noted   Acute encephalopathy 09/06/2019   Insulin dependent type 2 diabetes mellitus, uncontrolled 09/06/2019    Past Surgical History:  Procedure Laterality Date   ADENOIDECTOMY     DENTAL SURGERY     TONSILLECTOMY     tubes in ears         Family History  Problem Relation Age of Onset   Anxiety disorder Mother    Hypertension Mother    Diabetes Mother    Depression Mother    High Cholesterol Father    Anxiety disorder Father     Social History   Tobacco Use   Smoking status: Former    Types: Cigars   Smokeless tobacco: Current    Types: Snuff  Vaping Use   Vaping Use: Never used  Substance Use Topics   Alcohol use: Not Currently   Drug use: Not Currently    Home Medications Prior to Admission medications   Medication Sig Start Date End Date Taking? Authorizing Eric Kane  buPROPion (WELLBUTRIN XL) 150 MG 24 hr tablet Take 150 mg by mouth in the morning and at bedtime.  08/17/19   Eric Kane, Historical, Eric Kane  Cholecalciferol (VITAMIN D3) 50 MCG (2000 UT)  capsule Take 4,000 Units by mouth daily.    Eric Kane, Historical, Eric Kane  glimepiride (AMARYL) 2 MG tablet Take 1 tablet (2 mg total) by mouth daily with breakfast. 05/04/20   Terrilee Files, Eric Kane  insulin aspart (NOVOLOG) 100 UNIT/ML injection Inject 8 units after meals and Blood sugars greater than 150. Patient taking differently: Inject 10-50 Units into the skin See admin instructions. Sliding scale depending on blood sugar 08/27/19   Grayce Sessions, NP  lamoTRIgine (LAMICTAL) 150 MG tablet Take 300 mg by mouth at bedtime.  08/17/19   Eric Kane, Historical, Eric Kane  losartan (COZAAR) 25 MG tablet Take 25 mg by mouth daily. 05/03/20   Eric Kane, Historical, Eric Kane  meloxicam (MOBIC) 15 MG tablet Take 15 mg by mouth daily as needed for pain.  04/26/20   Eric Kane, Historical, Eric Kane  metFORMIN (GLUCOPHAGE) 1000 MG tablet Take 1,000 mg by mouth 2 (two) times daily. 05/08/20   Eric Kane, Historical, Eric Kane  Multiple Vitamin (MULTIVITAMIN WITH MINERALS) TABS tablet Take 1 tablet by mouth daily.    Eric Kane, Historical, Eric Kane  RABEprazole (ACIPHEX) 20 MG tablet Take 20 mg by mouth in the morning and at bedtime. 12/17/19   Eric Kane, Historical, Eric Kane  rosuvastatin (CRESTOR) 20 MG tablet Take 20 mg by mouth at bedtime. 04/14/20  Eric Kane, Historical, Eric Kane  Semaglutide,0.25 or 0.5MG /DOS, (OZEMPIC, 0.25 OR 0.5 MG/DOSE,) 2 MG/1.5ML SOPN Inject 0.19 mLs into the skin every 7 (seven) days. 05/22/20   Eric Kane, Historical, Eric Kane  vitamin B-12 (CYANOCOBALAMIN) 1000 MCG tablet Take 1,000 mcg by mouth daily.    Eric Kane, Historical, Eric Kane  insulin glargine (LANTUS) 100 UNIT/ML injection Inject 0.2 mLs (20 Units total) into the skin at bedtime. Patient not taking: Reported on 09/06/2019 08/27/19 06/04/20  Grayce Sessions, NP    Allergies    Cephalosporins and Lithium  Review of Systems   Review of Systems  Constitutional:  Negative for chills and fever.  Respiratory:  Negative for shortness of breath.   Cardiovascular:  Negative for chest pain.   Gastrointestinal:  Negative for diarrhea, nausea and vomiting.  Neurological:  Negative for weakness, numbness and headaches.  Psychiatric/Behavioral:  Negative for confusion.    Physical Exam Updated Vital Signs BP 103/79 (BP Location: Right Arm)    Pulse (!) 104    Temp 98 F (36.7 C) (Oral)    Resp 18    Ht 5\' 10"  (1.778 m)    Wt 115.7 kg    SpO2 94%    BMI 36.59 kg/m   Physical Exam Vitals and nursing note reviewed.  Constitutional:      General: He is not in acute distress.    Appearance: Normal appearance. He is not ill-appearing, toxic-appearing or diaphoretic.  HENT:     Head: Normocephalic and atraumatic.     Right Ear: External ear normal.     Left Ear: External ear normal.     Nose: Nose normal.     Mouth/Throat:     Mouth: Mucous membranes are moist.     Pharynx: Oropharynx is clear. No oropharyngeal exudate or posterior oropharyngeal erythema.  Eyes:     General: No scleral icterus.       Right eye: No discharge.        Left eye: No discharge.     Extraocular Movements: Extraocular movements intact.     Conjunctiva/sclera: Conjunctivae normal.     Pupils: Pupils are equal, round, and reactive to light.  Cardiovascular:     Rate and Rhythm: Normal rate and regular rhythm.     Pulses: Normal pulses.     Heart sounds: Normal heart sounds. No murmur heard.   No friction rub. No gallop.  Pulmonary:     Effort: Pulmonary effort is normal. No respiratory distress.     Breath sounds: Normal breath sounds. No stridor. No wheezing, rhonchi or rales.  Abdominal:     General: Abdomen is flat.     Palpations: Abdomen is soft.     Tenderness: There is no abdominal tenderness.  Musculoskeletal:        General: Normal range of motion.     Cervical back: Normal range of motion and neck supple. No tenderness.  Skin:    General: Skin is warm and dry.  Neurological:     General: No focal deficit present.     Mental Status: He is alert and oriented to person, place, and  time.     Comments: A&O x3.  Moving all 4 extremities with ease.  Strength is 5/5 in all 4 extremities.  Patient is able stand and ambulate with a steady gait.  Psychiatric:        Mood and Affect: Mood normal.        Behavior: Behavior normal.   ED Results / Procedures / Treatments  Labs (all labs ordered are listed, but only abnormal results are displayed) Labs Reviewed - No data to display  EKG None  Radiology No results found.  Procedures Procedures   Medications Ordered in ED Medications - No data to display  ED Course  I have reviewed the triage vital signs and the nursing notes.  Pertinent labs & imaging results that were available during my care of the patient were reviewed by me and considered in my medical decision making (see chart for details).    MDM Rules/Calculators/A&P                          Patient is a 50 year old male who presents to the emergency department at the request of his sober living facility due to alcohol intoxication.  Patient states that he drank wine as well as smoked marijuana this evening.    On my exam heart is regular rate and rhythm.  No murmurs, rubs, gallops.  Lungs clear to auscultation bilaterally.  Abdomen is soft and nontender.  Patient speaking clearly and coherently.  A&O x3.  Moving all 4 extremities with ease.  No tremors.  Ambulatory with a steady gait.  Does not exhibit signs of alcohol withdrawal.  Patient is requesting to be discharged so he can go back to [facility] [place].  Feel that this is reasonable.  Discussed return precautions.  Patient given resources for additional residential and outpatient substance abuse counseling.  Discussed return precautions.  His questions were answered and he was amicable at the time of discharge.  Final Clinical Impression(s) / ED Diagnoses Final diagnoses:  Alcoholic intoxication without complication [facility])   Rx / DC Orders ED Discharge Orders     None        Placido Sou, PA-C 08/05/21 2313    Placido Sou, PA-C 08/06/21 0008    Charlynne Pander, Eric Kane 08/06/21 [phone]

## 2021-08-05 NOTE — ED Provider Notes (Signed)
Emergency Medicine Provider Triage Evaluation Note  Eric Kane , a 50 y.o. male  was evaluated in triage.  Pt complains of etoh intoxication. He states he has been in a sober living facility but drank etoh today so they sent him here. He denies any injuries.  Review of Systems  Positive: intoxicated Negative: Head injury, pain  Physical Exam  There were no vitals taken for this visit. Gen:   Awake, no distress   Resp:  Normal effort  MSK:   Moves extremities without difficulty  Other:  Appears intoxicated but is alert and oriented. ambulatory  Medical Decision Making  Medically screening exam initiated at 9:22 PM.  Appropriate orders placed.  Eric Kane was informed that the remainder of the evaluation will be completed by another provider, this initial triage assessment does not replace that evaluation, and the importance of remaining in the ED until their evaluation is complete.     Eric Kane 08/05/21 2123    Gerhard Munch, MD 08/05/21 2157

## 2021-08-05 NOTE — ED Triage Notes (Signed)
Pt states that he lives at [facility] [place] and was told to come to the emergency room because he was caught drinking. Pt states that he drank 11 ounces of champagne.

## 2021-08-05 NOTE — Discharge Instructions (Addendum)
I have attached information for residential as well as outpatient counseling and substance abuse.  I have also attached information for local shelters.  Please come back to the emergency department with any new or worsening symptoms.
